# Patient Record
Sex: Male | Born: 1960 | Race: White | Hispanic: No | Marital: Single | Smoking: Current every day smoker
Health system: Southern US, Community
[De-identification: ages and names within clinical notes are randomized; demographics above are authoritative.]

---

## 2016-10-06 ENCOUNTER — Observation Stay: Payer: Self-pay

## 2016-10-06 ENCOUNTER — Encounter: Payer: Self-pay | Admitting: Emergency Medicine

## 2016-10-06 ENCOUNTER — Observation Stay
Admission: EM | Admit: 2016-10-06 | Discharge: 2016-10-07 | Disposition: A | Discharge status: Home / Self Care | Payer: Self-pay | Attending: Internal Medicine | Admitting: Internal Medicine

## 2016-10-06 ENCOUNTER — Emergency Department: Payer: Self-pay

## 2016-10-06 DIAGNOSIS — G459 Transient cerebral ischemic attack, unspecified: Secondary | ICD-10-CM

## 2016-10-06 DIAGNOSIS — H538 Other visual disturbances: Secondary | ICD-10-CM | POA: Insufficient documentation

## 2016-10-06 DIAGNOSIS — R531 Weakness: Principal | ICD-10-CM

## 2016-10-06 DIAGNOSIS — I639 Cerebral infarction, unspecified: Secondary | ICD-10-CM

## 2016-10-06 DIAGNOSIS — E876 Hypokalemia: Secondary | ICD-10-CM | POA: Insufficient documentation

## 2016-10-06 DIAGNOSIS — R55 Syncope and collapse: Secondary | ICD-10-CM | POA: Insufficient documentation

## 2016-10-06 DIAGNOSIS — R200 Anesthesia of skin: Secondary | ICD-10-CM | POA: Insufficient documentation

## 2016-10-06 DIAGNOSIS — I10 Essential (primary) hypertension: Secondary | ICD-10-CM | POA: Insufficient documentation

## 2016-10-06 DIAGNOSIS — F17210 Nicotine dependence, cigarettes, uncomplicated: Secondary | ICD-10-CM | POA: Insufficient documentation

## 2016-10-06 DIAGNOSIS — G8194 Hemiplegia, unspecified affecting left nondominant side: Secondary | ICD-10-CM

## 2016-10-06 DIAGNOSIS — R000 Tachycardia, unspecified: Secondary | ICD-10-CM

## 2016-10-06 DIAGNOSIS — R42 Dizziness and giddiness: Secondary | ICD-10-CM | POA: Insufficient documentation

## 2016-10-06 DIAGNOSIS — R739 Hyperglycemia, unspecified: Secondary | ICD-10-CM | POA: Insufficient documentation

## 2016-10-06 DIAGNOSIS — R251 Tremor, unspecified: Secondary | ICD-10-CM | POA: Insufficient documentation

## 2016-10-06 DIAGNOSIS — E872 Acidosis: Secondary | ICD-10-CM | POA: Insufficient documentation

## 2016-10-06 DIAGNOSIS — Z823 Family history of stroke: Secondary | ICD-10-CM | POA: Insufficient documentation

## 2016-10-06 LAB — TROPONIN I
Troponin I: <0.03 ng/mL (ref <0.03)
Troponin I: <0.03 ng/mL (ref <0.03)

## 2016-10-06 LAB — CBC
HCT: 52.8 % — ABNORMAL HIGH (ref 40.0–52.0)
Hemoglobin: 17.6 g/dL (ref 13.0–18.0)
MCH: 31 pg (ref 26.0–34.0)
MCHC: 33.3 g/dL (ref 32.0–36.0)
MCV: 92.8 fL (ref 80.0–100.0)
PLATELETS: 251 10*3/uL (ref 150–440)
RBC: 5.69 MIL/uL (ref 4.40–5.90)
RDW: 16.1 % — AB (ref 11.5–14.5)
WBC: 4.9 10*3/uL (ref 3.8–10.6)

## 2016-10-06 LAB — GLUCOSE, CAPILLARY: GLUCOSE-CAPILLARY: 107 mg/dL — AB (ref 65–99)

## 2016-10-06 LAB — PROTIME-INR
INR: 1.08
Prothrombin Time: 14 seconds (ref 11.4–15.2)

## 2016-10-06 LAB — DIFFERENTIAL
BASOS ABS: 0 10*3/uL (ref 0–0.1)
BASOS PCT: 1 %
EOS ABS: 0 10*3/uL (ref 0–0.7)
Eosinophils Relative: 0 %
Lymphocytes Relative: 12 %
Lymphs Abs: 0.6 10*3/uL — ABNORMAL LOW (ref 1.0–3.6)
Monocytes Absolute: 0.4 10*3/uL (ref 0.2–1.0)
Monocytes Relative: 9 %
NEUTROS ABS: 3.9 10*3/uL (ref 1.4–6.5)
NEUTROS PCT: 78 %

## 2016-10-06 LAB — MAGNESIUM: Magnesium: 1.7 mg/dL (ref 1.7–2.4)

## 2016-10-06 LAB — COMPREHENSIVE METABOLIC PANEL
ALT: 17 U/L (ref 17–63)
AST: 31 U/L (ref 15–41)
Albumin: 4.1 g/dL (ref 3.5–5.0)
Alkaline Phosphatase: 111 U/L (ref 38–126)
Anion gap: 12 (ref 5–15)
CHLORIDE: 105 mmol/L (ref 101–111)
CO2: 21 mmol/L — AB (ref 22–32)
Calcium: 9.1 mg/dL (ref 8.9–10.3)
Creatinine, Ser: 0.93 mg/dL (ref 0.61–1.24)
GFR calc Af Amer: >60 mL/min (ref >60)
Glucose, Bld: 114 mg/dL — ABNORMAL HIGH (ref 65–99)
Potassium: 2.8 mmol/L — ABNORMAL LOW (ref 3.5–5.1)
Sodium: 138 mmol/L (ref 135–145)
TOTAL PROTEIN: 7.6 g/dL (ref 6.5–8.1)
Total Bilirubin: 0.7 mg/dL (ref 0.3–1.2)

## 2016-10-06 LAB — APTT: APTT: 26 s (ref 24–36)

## 2016-10-06 LAB — LACTIC ACID, PLASMA
Lactic Acid, Venous: 1.8 mmol/L (ref 0.5–1.9)
Lactic Acid, Venous: 1.2 mmol/L (ref 0.5–1.9)

## 2016-10-06 LAB — BETA-HYDROXYBUTYRIC ACID: Beta-Hydroxybutyric Acid: 0.21 mmol/L (ref 0.05–0.27)

## 2016-10-06 LAB — POTASSIUM: Potassium: 3.6 mmol/L (ref 3.5–5.1)

## 2016-10-06 LAB — TSH: TSH: 1.141 u[IU]/mL (ref 0.350–4.500)

## 2016-10-06 MED ORDER — LORAZEPAM 2 MG/ML IJ SOLN
1.0000 mg | Freq: Once | INTRAMUSCULAR | Status: AC
  Administered 2016-10-06: 1 mg via INTRAVENOUS
  Filled 2016-10-06: qty 1

## 2016-10-06 MED ORDER — SODIUM CHLORIDE 0.9% FLUSH
3.0000 mL | Freq: Two times a day (BID) | INTRAVENOUS | Status: DC
  Administered 2016-10-07: 3 mL via INTRAVENOUS

## 2016-10-06 MED ORDER — SODIUM CHLORIDE 0.9% FLUSH: 3.0000 mL | INTRAVENOUS | Status: DC | PRN

## 2016-10-06 MED ORDER — ONDANSETRON HCL 4 MG PO TABS: 4.0000 mg | ORAL_TABLET | Freq: Four times a day (QID) | ORAL | Status: DC | PRN

## 2016-10-06 MED ORDER — ACETAMINOPHEN 325 MG PO TABS: 650.0000 mg | ORAL_TABLET | Freq: Four times a day (QID) | ORAL | Status: DC | PRN

## 2016-10-06 MED ORDER — LORAZEPAM 2 MG/ML IJ SOLN
0.5000 mg | Freq: Once | INTRAMUSCULAR | Status: AC
  Administered 2016-10-06: 0.5 mg via INTRAVENOUS
  Filled 2016-10-06: qty 1

## 2016-10-06 MED ORDER — SODIUM CHLORIDE 0.9 % IV SOLN: 250.0000 mL | INTRAVENOUS | Status: DC | PRN

## 2016-10-06 MED ORDER — SODIUM CHLORIDE 0.9% FLUSH
3.0000 mL | Freq: Two times a day (BID) | INTRAVENOUS | Status: DC
  Administered 2016-10-06: 3 mL via INTRAVENOUS

## 2016-10-06 MED ORDER — IOPAMIDOL (ISOVUE-370) INJECTION 76%
75.0000 mL | Freq: Once | INTRAVENOUS | Status: AC | PRN
  Administered 2016-10-06: 75 mL via INTRAVENOUS

## 2016-10-06 MED ORDER — POTASSIUM CHLORIDE CRYS ER 20 MEQ PO TBCR
40.0000 meq | EXTENDED_RELEASE_TABLET | ORAL | Status: AC
  Administered 2016-10-06 – 2016-10-07 (×3): 40 meq via ORAL
  Filled 2016-10-06 (×3): qty 2

## 2016-10-06 MED ORDER — ASPIRIN EC 81 MG PO TBEC
81.0000 mg | DELAYED_RELEASE_TABLET | Freq: Every day | ORAL | Status: DC
  Administered 2016-10-06 – 2016-10-07 (×2): 81 mg via ORAL
  Filled 2016-10-06 (×2): qty 1

## 2016-10-06 MED ORDER — ACETAMINOPHEN 650 MG RE SUPP: 650.0000 mg | Freq: Four times a day (QID) | RECTAL | Status: DC | PRN

## 2016-10-06 MED ORDER — ONDANSETRON HCL 4 MG/2ML IJ SOLN: 4.0000 mg | Freq: Four times a day (QID) | INTRAMUSCULAR | Status: DC | PRN

## 2016-10-06 MED ORDER — AMLODIPINE BESYLATE 5 MG PO TABS
5.0000 mg | ORAL_TABLET | Freq: Every day | ORAL | Status: DC
Start: 2016-10-06 — End: 2016-10-07
  Administered 2016-10-06 – 2016-10-07 (×2): 5 mg via ORAL
  Filled 2016-10-06 (×2): qty 1

## 2016-10-06 MED ORDER — NICOTINE 21 MG/24HR TD PT24
21.0000 mg | MEDICATED_PATCH | Freq: Every day | TRANSDERMAL | Status: DC
  Filled 2016-10-06: qty 1

## 2016-10-06 MED ORDER — ENOXAPARIN SODIUM 40 MG/0.4ML ~~LOC~~ SOLN
40.0000 mg | SUBCUTANEOUS | Status: DC
  Administered 2016-10-06: 40 mg via SUBCUTANEOUS
  Filled 2016-10-06: qty 0.4

## 2016-10-06 MED ORDER — POTASSIUM CHLORIDE CRYS ER 20 MEQ PO TBCR
40.0000 meq | EXTENDED_RELEASE_TABLET | Freq: Once | ORAL | Status: AC
  Administered 2016-10-06: 40 meq via ORAL
  Filled 2016-10-06: qty 2

## 2016-10-06 MED ORDER — IBUPROFEN 200 MG PO TABS
200.0000 mg | ORAL_TABLET | Freq: Four times a day (QID) | ORAL | Status: DC | PRN
  Filled 2016-10-06: qty 1

## 2016-10-06 NOTE — ED Triage Notes (Signed)
Pt presents to ED with son with reports of sudden onset of weakness, diaphoresis, and left arm numbness and legs tingling. C/o pain to the back of the head 8/10

## 2016-10-06 NOTE — ED Notes (Signed)
Spoke with Dr. Winona Legato about patient needing medication for claustrophobia, new order for Ativan given.

## 2016-10-06 NOTE — ED Notes (Signed)
Dr. Thad Ranger in room assessing patient.

## 2016-10-06 NOTE — ED Notes (Signed)
Code  Stroke  Called  To 333 

## 2016-10-06 NOTE — H&P (Addendum)
[facility] -  at [facility]   PATIENT NAME: Jon Michael    MR#:  [id number]  DATE OF BIRTH:  1960/09/10  DATE OF ADMISSION:  10/06/2016  PRIMARY CARE PHYSICIAN: No PCP Per Patient   REQUESTING/REFERRING PHYSICIAN:   CHIEF COMPLAINT:   Chief Complaint  Patient presents with  . Code Stroke    HISTORY OF PRESENT ILLNESS: Jon Michael  is a 56 y.o. male with Past medical history, who presents to the hospital with complaints of left-sided weakness. According to the patient, he was sitting at home at around 10 AM, when he suddenly started sweating, felt nauseated, started dry heaving, and blacked out. He did not know exactly how long he was out, however, upon awakening, he called his family and he was noted to have left-sided weakness and was brought to emergency room for further evaluation and treatment. He is also complaining of significant headaches in the back of the head for the past 2 weeks. In the hospital. He was noted to have an H SS score of 2, he was anxious, was seen by neurologist amended to admit him for possible stroke evaluation. Head CT, CT angiogram of head and neck were unremarkable.  PAST MEDICAL HISTORY:  History reviewed. No pertinent past medical history.  PAST SURGICAL HISTORY: History reviewed. No pertinent surgical history.  SOCIAL HISTORY:  Social History  Substance Use Topics  . Smoking status: Current Every Day Smoker  . Smokeless tobacco: Not on file  . Alcohol use Yes    FAMILY HISTORY: History reviewed. No pertinent family history.  DRUG ALLERGIES: No Known Allergies  Review of Systems  Constitutional: Negative for chills, fever and weight loss.  HENT: Negative for congestion.   Eyes: Positive for blurred vision. Negative for double vision.  Respiratory: Negative for cough, sputum production, shortness of breath and wheezing.   Cardiovascular: Positive for chest pain and palpitations. Negative for orthopnea, leg  swelling and PND.  Gastrointestinal: Positive for diarrhea and nausea. Negative for abdominal pain, blood in stool, constipation and vomiting.  Genitourinary: Negative for dysuria, frequency, hematuria and urgency.  Musculoskeletal: Negative for falls.  Neurological: Positive for loss of consciousness and weakness. Negative for dizziness, tremors, focal weakness and headaches.  Endo/Heme/Allergies: Does not bruise/bleed easily.  Psychiatric/Behavioral: Negative for depression. The patient does not have insomnia.     MEDICATIONS AT HOME:  Prior to Admission medications   Medication Sig Start Date End Date Taking? Authorizing Provider  Aspirin-Acetaminophen-Caffeine (GOODY HEADACHE PO) Take 1 packet by mouth as needed.   Yes Historical Provider, MD  ibuprofen (ADVIL,MOTRIN) 200 MG tablet Take 200 mg by mouth every 6 (six) hours as needed.   Yes Historical Provider, MD      PHYSICAL EXAMINATION:   VITAL SIGNS: Blood pressure (!) 134/92, pulse (!) 111, temperature 98 F (36.7 C), resp. rate (!) 21, height  (1.676 m), weight 63.5 kg (140 lb), SpO2 100 %.  GENERAL:  56 y.o.-year-old patient lying in the bed with no acute distress, somewhat anxious and fast speech.  EYES: Pupils equal, round, reactive to light and accommodation. No scleral icterus. Extraocular muscles intact.  HEENT: Head atraumatic, normocephalic. Oropharynx and nasopharynx clear.  NECK:  Supple, no jugular venous distention. No thyroid enlargement, no tenderness.  LUNGS: Normal breath sounds bilaterally, no wheezing, rales,rhonchi or crepitation. No use of accessory muscles of respiration.  CARDIOVASCULAR: S1, S2 normal. No murmurs, rubs, or gallops.  ABDOMEN: Soft, nontender, nondistended. Bowel sounds present. No  organomegaly or mass.  EXTREMITIES: No pedal edema, cyanosis, or clubbing.  NEUROLOGIC: Cranial nerves II through XII are grossly intact. Muscle strength 5/5 in upper extremities, inconsistent effort in   lower extremities, but some weakness was noted in left lower extremity. Sensation grossly intact. Gait not checked.  PSYCHIATRIC: The patient is alert and oriented x 3. Speech is normal SKIN: No obvious rash, lesion, or ulcer.   LABORATORY PANEL:   CBC  Recent Labs Lab 10/06/16 1058  WBC 4.9  HGB 17.6  HCT 52.8*  PLT 251  MCV 92.8  MCH 31.0  MCHC 33.3  RDW 16.1*  LYMPHSABS 0.6*  MONOABS 0.4  EOSABS 0.0  BASOSABS 0.0   ------------------------------------------------------------------------------------------------------------------  Chemistries   Recent Labs Lab 10/06/16 1058  NA 138  K 2.8*  CL 105  CO2 21*  GLUCOSE 114*  BUN <5*  CREATININE 0.93  CALCIUM 9.1  AST 31  ALT 17  ALKPHOS 111  BILITOT 0.7   ------------------------------------------------------------------------------------------------------------------  Cardiac Enzymes  Recent Labs Lab 10/06/16 1058  TROPONINI <0.03   ------------------------------------------------------------------------------------------------------------------  RADIOLOGY: Ct Angio Head W Or Wo Contrast  Result Date: 10/06/2016 CLINICAL DATA:  56 year old male with sudden onset weakness, diaphoresis and left extremity numbness since 1009 hours. EXAM: CT ANGIOGRAPHY HEAD AND NECK TECHNIQUE: Multidetector CT imaging of the head and neck was performed using the standard protocol during bolus administration of intravenous contrast. Multiplanar CT image reconstructions and MIPs were obtained to evaluate the vascular anatomy. Carotid stenosis measurements (when applicable) are obtained utilizing NASCET criteria, using the distal internal carotid diameter as the denominator. CONTRAST:  75 mL Isovue 370. COMPARISON:  Head CT without contrast 1049 hours today. FINDINGS: CTA NECK Skeleton: Scattered dental caries and poor dentition. Widespread cervical spine degeneration. Evidence of mild degenerative spinal stenosis at C4-C5, and mild  to moderate spinal stenosis at C5-C6. Mild upper thoracic scoliosis. No acute osseous abnormality identified. Visualized paranasal sinuses and mastoids are stable and well pneumatized. Upper chest: Mild emphysema. Mild dependent atelectasis. No superior mediastinal lymphadenopathy. Other neck: Subcentimeter bilateral thyroid nodules, do not meet consensus criteria for ultrasound follow-up. Mildly asymmetric contour of the larynx, appears benign. Negative pharynx, parapharyngeal spaces, retropharyngeal space, sublingual space, submandibular glands and parotid glands. No cervical lymphadenopathy. Aortic arch: 3 vessel arch configuration. No arch atherosclerosis or great vessel origin stenosis. Right carotid system: Negative aside from mild soft plaque in the posterior right ICA origin and bulb (series 4, image 73). No stenosis. Left carotid system: Negative. Vertebral arteries: Minimal calcified plaque at the right subclavian artery origin without stenosis. Normal right vertebral artery origin. Mildly dominant right vertebral artery is normal to the skullbase without stenosis. No proximal left subclavian artery stenosis. Normal left vertebral artery origin. Mildly non dominant left vertebral artery is normal to the skullbase. CTA HEAD Posterior circulation: No distal vertebral artery stenosis. Dominant right vertebral artery. Normal vertebrobasilar junction. Dominant appearing bilateral AICA origins are normal. No basilar stenosis. SCA and PCA origins are normal. Posterior communicating arteries are diminutive or absent. Bilateral PCA branches are within normal limits. Anterior circulation: Both ICA siphons are patent. No siphon atherosclerosis or stenosis. Normal ophthalmic artery origins. Normal carotid termini, MCA and ACA origins. Diminutive or absent anterior communicating artery. Bilateral ACA branches are normal. Left MCA M1 segment, bifurcation, and left MCA branches are normal. Right MCA M1 segment,  bifurcation, and right MCA branches are within normal limits. Venous sinuses: Patent. Anatomic variants: Dominant right vertebral artery. Delayed phase: Stable and normal gray-white  matter differentiation. No abnormal enhancement identified. Review of the MIP images confirms the above findings IMPRESSION: 1. Negative for emergent large vessel occlusion, and normal arterial findings on CTA head and neck except for mild soft plaque at the right ICA origin. 2. Stable and normal CT appearance of the brain. 3. Widespread cervical spine degeneration. Mild C4-C5 and mild to moderate C5-C6 degenerative spinal stenosis suspected. 4. Mild emphysema. Preliminary report of the above was relayed via text pager to Dr. Thana Farr on 10/06/2016 at 11:46 . Electronically Signed   By: Odessa Fleming M.D.   On: 10/06/2016 12:11   Ct Angio Neck W Or Wo Contrast  Result Date: 10/06/2016 CLINICAL DATA:  56 year old male with sudden onset weakness, diaphoresis and left extremity numbness since 1009 hours. EXAM: CT ANGIOGRAPHY HEAD AND NECK TECHNIQUE: Multidetector CT imaging of the head and neck was performed using the standard protocol during bolus administration of intravenous contrast. Multiplanar CT image reconstructions and MIPs were obtained to evaluate the vascular anatomy. Carotid stenosis measurements (when applicable) are obtained utilizing NASCET criteria, using the distal internal carotid diameter as the denominator. CONTRAST:  75 mL Isovue 370. COMPARISON:  Head CT without contrast 1049 hours today. FINDINGS: CTA NECK Skeleton: Scattered dental caries and poor dentition. Widespread cervical spine degeneration. Evidence of mild degenerative spinal stenosis at C4-C5, and mild to moderate spinal stenosis at C5-C6. Mild upper thoracic scoliosis. No acute osseous abnormality identified. Visualized paranasal sinuses and mastoids are stable and well pneumatized. Upper chest: Mild emphysema. Mild dependent atelectasis. No superior  mediastinal lymphadenopathy. Other neck: Subcentimeter bilateral thyroid nodules, do not meet consensus criteria for ultrasound follow-up. Mildly asymmetric contour of the larynx, appears benign. Negative pharynx, parapharyngeal spaces, retropharyngeal space, sublingual space, submandibular glands and parotid glands. No cervical lymphadenopathy. Aortic arch: 3 vessel arch configuration. No arch atherosclerosis or great vessel origin stenosis. Right carotid system: Negative aside from mild soft plaque in the posterior right ICA origin and bulb (series 4, image 73). No stenosis. Left carotid system: Negative. Vertebral arteries: Minimal calcified plaque at the right subclavian artery origin without stenosis. Normal right vertebral artery origin. Mildly dominant right vertebral artery is normal to the skullbase without stenosis. No proximal left subclavian artery stenosis. Normal left vertebral artery origin. Mildly non dominant left vertebral artery is normal to the skullbase. CTA HEAD Posterior circulation: No distal vertebral artery stenosis. Dominant right vertebral artery. Normal vertebrobasilar junction. Dominant appearing bilateral AICA origins are normal. No basilar stenosis. SCA and PCA origins are normal. Posterior communicating arteries are diminutive or absent. Bilateral PCA branches are within normal limits. Anterior circulation: Both ICA siphons are patent. No siphon atherosclerosis or stenosis. Normal ophthalmic artery origins. Normal carotid termini, MCA and ACA origins. Diminutive or absent anterior communicating artery. Bilateral ACA branches are normal. Left MCA M1 segment, bifurcation, and left MCA branches are normal. Right MCA M1 segment, bifurcation, and right MCA branches are within normal limits. Venous sinuses: Patent. Anatomic variants: Dominant right vertebral artery. Delayed phase: Stable and normal gray-white matter differentiation. No abnormal enhancement identified. Review of the MIP  images confirms the above findings IMPRESSION: 1. Negative for emergent large vessel occlusion, and normal arterial findings on CTA head and neck except for mild soft plaque at the right ICA origin. 2. Stable and normal CT appearance of the brain. 3. Widespread cervical spine degeneration. Mild C4-C5 and mild to moderate C5-C6 degenerative spinal stenosis suspected. 4. Mild emphysema. Preliminary report of the above was relayed via text pager to  Dr. Thana Farr on 10/06/2016 at 11:46 . Electronically Signed   By: Odessa Fleming M.D.   On: 10/06/2016 12:11   Ct Head Code Stroke W/o Cm  Result Date: 10/06/2016 CLINICAL DATA:  Code stroke. Sudden onset of left upper and lower extremity numbness orbit. Larey Seat off of a brick wall. EXAM: CT HEAD WITHOUT CONTRAST TECHNIQUE: Contiguous axial images were obtained from the base of the skull through the vertex without intravenous contrast. COMPARISON:  None. FINDINGS: Brain: No acute infarct, hemorrhage, or mass lesion is present. The ventricles are of normal size. No significant extraaxial fluid collection is present. Basal ganglia are within normal limits bilaterally. The insular ribbon is unremarkable. No focal cortical lesion is present. The brainstem and cerebellum are normal. Vascular: No hyperdense vessel or unexpected calcification. Skull: The calvarium is intact. No focal lytic or blastic lesions are present. Sinuses/Orbits: The paranasal sinuses and mastoid air cells are clear. The globes and orbits are within normal limits. ASPECTS [facility] Stroke Program Early CT Score) - Ganglionic level infarction (caudate, lentiform nuclei, internal capsule, insula, M1-M3 cortex): 7/7 - Supraganglionic infarction (M4-M6 cortex): 3/3 Total score (0-10 with 10 being normal): 10/10 IMPRESSION: 1. Normal CT of the brain for age.  No acute infarct. 2. ASPECTS is 10/10 These results were called by telephone at the time of interpretation on 10/06/2016 at 11:03 am to Dr. Jene Every ,  who verbally acknowledged these results. Electronically Signed   By: Marin Roberts M.D.   On: 10/06/2016 11:03    EKG: Orders placed or performed during the hospital encounter of 10/06/16  . ED EKG  . ED EKG  . EKG 12-Lead  . EKG 12-Lead  EKG in the Emergency room revealed sinus tachycardia at rate of 103 bpm, atrial premature complexes, left atrial enlargement, right ventricular hypertrophy, prolonged QTC to 480 ms  IMPRESSION AND PLAN:  Active Problems:   Left-sided weakness #1 left Sided weakness, admit patient to medical floor, CT of the head as well as CT angiogram of head and neck were unremarkable. appreciate neurologist input. Continue aspirin, Lipitor, lipid panel in the morning, get hemoglobin A1c, TSH, get brain MRI, echocardiogram #2 Hypokalemia, supplementing intravenously, orally, check magnesium level, recheck labs in the morning #3. Acidosis, check beta hydroxy deteriorate, lactic acid levels, show bicarbonate level in the morning #4. Hyperglycemia, get hemoglobin A1c #5. Hemoconcentration, questionable secondary due to tobacco abuse, follow in the morning with hydration #6. Tobacco abuse. Counseling, discussed this patient for 4 minutes, nicotine replacement therapy will be initiated, he is agreeable #7. Elevated blood pressure, initiate patient on Norvasc  All the records are reviewed and case discussed with ED provider. Management plans discussed with the patient, family and they are in agreement.  CODE STATUS: Code Status History    This patient does not have a recorded code status. Please follow your organizational policy for patients in this situation.       TOTAL TIME TAKING CARE OF THIS PATIENT: 50 minutes.    Katharina Caper M.D on 10/06/2016 at 1:25 PM  Between 7am to 6pm - Pager - [phone] After 6pm go to [email or web address] - password [email or web address] [facility]  [facility]  Office  [phone]  CC: Primary care physician; No PCP Per  Patient

## 2016-10-06 NOTE — Consult Note (Addendum)
Referring Physician: Roxan Hockey    Chief Complaint: Left sided weakness/numbness, blurred vision, dizziness, tremors  HPI: Jon Michael is an 56 y.o. male who reports that he awakened at baseline today.  Was sitting on a wall and had the onset of severe occipital headache, then became dizzy.  Patient fell off the wall at that time and there is a question of loss of consciousness.  Patient now with left sided numbness and weakness.  Patient reports that he has a history of headaches that are usually frontal but for the past couple of weeks they have been occipital.  Currently throbbing and rated at 8/10 severity.  It appears that right before his symptoms presented themselves he was involved in something that disturbed him greatly.   Initial NIHSS of 2.    Date last known well: Date: 10/06/2016 Time last known well: Time: 10:00 tPA Given: No: Minimal symptoms  History reviewed. No pertinent past medical history.  History reviewed. No pertinent surgical history.  Family history: Father with a stroke. Son alive and well.    Social History:  reports that he has been smoking.  He does not have any smokeless tobacco history on file. He reports that he drinks alcohol. His drug history is not on file.  Allergies: No Known Allergies  Medications: I have reviewed the patient's current medications. Prior to Admission:  None  ROS: History obtained from the patient  General ROS: negative for - chills, fatigue, fever, night sweats, weight gain or weight loss Psychological ROS: anxiety Ophthalmic ROS: as noted in HPI, longstanding vision issues with left worse than right ENT ROS: negative for - epistaxis, nasal discharge, oral lesions, sore throat, tinnitus or vertigo Allergy and Immunology ROS: negative for - hives or itchy/watery eyes Hematological and Lymphatic ROS: negative for - bleeding problems, bruising or swollen lymph nodes Endocrine ROS: negative for - galactorrhea, hair pattern changes,  polydipsia/polyuria or temperature intolerance Respiratory ROS: negative for - cough, hemoptysis, shortness of breath or wheezing Cardiovascular ROS: negative for - chest pain, dyspnea on exertion, edema or irregular heartbeat Gastrointestinal ROS: negative for - abdominal pain, diarrhea, hematemesis, nausea/vomiting or stool incontinence Genito-Urinary ROS: negative for - dysuria, hematuria, incontinence or urinary frequency/urgency Musculoskeletal ROS: negative for - joint swelling or muscular weakness Neurological ROS: as noted in HPI Dermatological ROS: negative for rash and skin lesion changes  Physical Examination: Blood pressure 127/80, pulse (!) 109, temperature 98 F (36.7 C), temperature source Oral, resp. rate 20, height  (1.676 m), weight 63.5 kg (140 lb), SpO2 100 %.  Gen: Patient anxious and hyperventilating.  Tremors all over. HEENT-  Normocephalic, no lesions, without obvious abnormality.  Normal external eye and conjunctiva.  Normal TM's bilaterally.  Normal auditory canals and external ears. Normal external nose, mucus membranes and septum.  Normal pharynx. Cardiovascular- S1, S2 normal, pulses palpable throughout   Lungs- chest clear, no wheezing, rales, normal symmetric air entry Abdomen- soft, non-tender; bowel sounds normal; no masses,  no organomegaly Extremities- no edema Lymph-no adenopathy palpable Musculoskeletal-no joint tenderness, deformity or swelling Skin-warm and dry, no hyperpigmentation, vitiligo, or suspicious lesions  Neurological Examination   Mental Status: Alert, oriented, thought content appropriate.  Speech fluent without evidence of aphasia.  Able to follow 3 step commands without difficulty.  Anxious.  Tremulous. Cranial Nerves: II: Discs flat bilaterally; Bilateral blurry vision with left worse than right.  Able to count fingers in all visual fields.  Pupils equal, round, reactive to light and accommodation III,IV, VI: ptosis  not present,  extra-ocular motions intact bilaterally V,VII: smile symmetric, facial light touch sensation decreased on the left VIII: hearing normal bilaterally IX,X: gag reflex present XI: bilateral shoulder shrug XII: midline tongue extension Motor: Right : Upper extremity   5/5    Left:     Upper extremity   5-/5  Lower extremity   5/5     Lower extremity   5-/5 Tone and bulk:normal tone throughout; no atrophy noted Sensory: Pinprick and light touch decreased in there fingertips and in the left leg below the knee.   Deep Tendon Reflexes: 2+ and symmetric throughout Plantars: Right: downgoing   Left: downgoing Cerebellar: Normal finger-to-nose and normal heel-to-shin testing bilaterally Gait: not tested due to safety concerns    Laboratory Studies:  Basic Metabolic Panel: No results for input(s): NA, K, CL, CO2, GLUCOSE, BUN, CREATININE, CALCIUM, MG, PHOS in the last 168 hours.  Liver Function Tests: No results for input(s): AST, ALT, ALKPHOS, BILITOT, PROT, ALBUMIN in the last 168 hours. No results for input(s): LIPASE, AMYLASE in the last 168 hours. No results for input(s): AMMONIA in the last 168 hours.  CBC:  Recent Labs Lab 10/06/16 1058  WBC 4.9  NEUTROABS 3.9  HGB 17.6  HCT 52.8*  MCV 92.8  PLT 251    Cardiac Enzymes: No results for input(s): CKTOTAL, CKMB, CKMBINDEX, TROPONINI in the last 168 hours.  BNP: Invalid input(s): POCBNP  CBG:  Recent Labs Lab 10/06/16 1055  GLUCAP 107*    Microbiology: No results found for this or any previous visit.  Coagulation Studies: No results for input(s): LABPROT, INR in the last 72 hours.  Urinalysis: No results for input(s): COLORURINE, LABSPEC, PHURINE, GLUCOSEU, HGBUR, BILIRUBINUR, KETONESUR, PROTEINUR, UROBILINOGEN, NITRITE, LEUKOCYTESUR in the last 168 hours.  Invalid input(s): APPERANCEUR  Lipid Panel: No results found for: CHOL, TRIG, HDL, CHOLHDL, VLDL, LDLCALC  HgbA1C: No results found for: HGBA1C  Urine  Drug Screen:  No results found for: LABOPIA, COCAINSCRNUR, LABBENZ, AMPHETMU, THCU, LABBARB  Alcohol Level: No results for input(s): ETH in the last 168 hours.  Other results: EKG: sinus tachycardia.  Imaging: Ct Head Code Stroke W/o Cm  Result Date: 10/06/2016 CLINICAL DATA:  Code stroke. Sudden onset of left upper and lower extremity numbness orbit. Larey Seat off of a brick wall. EXAM: CT HEAD WITHOUT CONTRAST TECHNIQUE: Contiguous axial images were obtained from the base of the skull through the vertex without intravenous contrast. COMPARISON:  None. FINDINGS: Brain: No acute infarct, hemorrhage, or mass lesion is present. The ventricles are of normal size. No significant extraaxial fluid collection is present. Basal ganglia are within normal limits bilaterally. The insular ribbon is unremarkable. No focal cortical lesion is present. The brainstem and cerebellum are normal. Vascular: No hyperdense vessel or unexpected calcification. Skull: The calvarium is intact. No focal lytic or blastic lesions are present. Sinuses/Orbits: The paranasal sinuses and mastoid air cells are clear. The globes and orbits are within normal limits. ASPECTS [facility] Stroke Program Early CT Score) - Ganglionic level infarction (caudate, lentiform nuclei, internal capsule, insula, M1-M3 cortex): 7/7 - Supraganglionic infarction (M4-M6 cortex): 3/3 Total score (0-10 with 10 being normal): 10/10 IMPRESSION: 1. Normal CT of the brain for age.  No acute infarct. 2. ASPECTS is 10/10 These results were called by telephone at the time of interpretation on 10/06/2016 at 11:03 am to Dr. Jene Every , who verbally acknowledged these results. Electronically Signed   By: Marin Roberts M.D.   On: 10/06/2016 11:03  Assessment: 56 y.o. male presenting with multiple symptoms including headache, dizziness, visual blurring, tremors, left sided numbness and weakness.  Patient very anxious.  NIHSS of 2.  Head CT reviewed and shows no acute  changes.  CTA of the head and neck ordered and was only remarkable for a mild soft plaque in the right ICA origin.  After discussion with the patient and his son it was decided against tPA.  Patient to be admitted for further work up.     Stroke Risk Factors - smoking  Plan: 1. HgbA1c, fasting lipid panel 2. MRI of the brain without contrast 3. PT consult, OT consult, Speech consult 4. Echocardiogram 5. Carotid dopplers 6. Prophylactic therapy-Antiplatelet med: Aspirin - dose  daily 7. NPO until RN stroke swallow screen 8. Telemetry monitoring 9. Frequent neuro checks 10. Smoking cessation counseling    Thana Farr, MD Neurology [phone] 10/06/2016, 11:13 AM

## 2016-10-06 NOTE — Progress Notes (Signed)
PT Cancellation Note  Patient Details Name: Jon Michael MRN: [id number] DOB: 1960-07-23   Cancelled Treatment:    Reason Eval/Treat Not Completed: Patient at procedure or test/unavailable (Pt currently off floor for MRI).  Will continue to follow acutely.   Encarnacion Chu PT, DPT 10/06/2016, 3:26 PM

## 2016-10-06 NOTE — ED Provider Notes (Addendum)
[facility] Emergency Department Provider Note    First MD Initiated Contact with Patient 10/06/16 1101     (approximate)  I have reviewed the triage vital signs and the nursing notes.   HISTORY  Chief Complaint Code Stroke    HPI Jon Michael is a 56 y.o. male presents with chief complaint of sudden onset back headache associated with numbness and tingling the left side of his body and blurry vision. Patient states that the symptoms started around 10 AM this morning. No trauma. Denies any chest pain. No shortness of breath. He is not on any blood thinners. No recent fevers. Has never felt like this before.   History reviewed. No pertinent past medical history. History reviewed. No pertinent family history. History reviewed. No pertinent surgical history. There are no active problems to display for this patient.     Prior to Admission medications   Medication Sig Start Date End Date Taking? Authorizing Provider  Aspirin-Acetaminophen-Caffeine (GOODY HEADACHE PO) Take 1 packet by mouth as needed.   Yes Historical Provider, MD  ibuprofen (ADVIL,MOTRIN) 200 MG tablet Take 200 mg by mouth every 6 (six) hours as needed.   Yes Historical Provider, MD    Allergies Patient has no known allergies.    Social History Social History  Substance Use Topics  . Smoking status: Current Every Day Smoker  . Smokeless tobacco: Not on file  . Alcohol use Yes    Review of Systems Patient denies headaches, rhinorrhea, blurry vision, numbness, shortness of breath, chest pain, edema, cough, abdominal pain, nausea, vomiting, diarrhea, dysuria, fevers, rashes or hallucinations unless otherwise stated above in HPI. ____________________________________________   PHYSICAL EXAM:  VITAL SIGNS: Vitals:   10/06/16 1115 10/06/16 1154  BP: (!) 134/92   Pulse: (!) 111   Resp: (!) 21   Temp:  98 F (36.7 C)    Constitutional: Alert and oriented.anxious appearing   But in no acute distress. Eyes: Conjunctivae are normal. PERRL. EOMI. Head: Atraumatic. Nose: No congestion/rhinnorhea. Mouth/Throat: Mucous membranes are moist.  Oropharynx non-erythematous. Neck: No stridor. Painless ROM. No cervical spine tenderness to palpation Hematological/Lymphatic/Immunilogical: No cervical lymphadenopathy. Cardiovascular: mildly tachycardic rate, regular rhythm. Grossly normal heart sounds.  Good peripheral circulation. Respiratory: Normal respiratory effort.  No retractions. Lungs CTAB. Gastrointestinal: Soft and nontender. No distention. No abdominal bruits. No CVA tenderness. Musculoskeletal: No lower extremity tenderness nor edema.  No joint effusions. Neurologic:  Normal speech and language. No gross focal neurologic deficits are appreciated. Subjective decreased sensation to left side.  NIH 2 Skin:  Skin is warm, dry and intact. No rash noted. Psychiatric: anxious  ____________________________________________   LABS (all labs ordered are listed, but only abnormal results are displayed)  Results for orders placed or performed during the hospital encounter of 10/06/16 (from the past 24 hour(s))  Glucose, capillary     Status: Abnormal   Collection Time: 10/06/16 10:55 AM  Result Value Ref Range   Glucose-Capillary 107 (H) 65 - 99 mg/dL  Protime-INR     Status: None   Collection Time: 10/06/16 10:58 AM  Result Value Ref Range   Prothrombin Time 14.0 11.4 - 15.2 seconds   INR 1.08   APTT     Status: None   Collection Time: 10/06/16 10:58 AM  Result Value Ref Range   aPTT 26 24 - 36 seconds  CBC     Status: Abnormal   Collection Time: 10/06/16 10:58 AM  Result Value Ref Range   WBC 4.9  3.8 - 10.6 K/uL   RBC 5.69 4.40 - 5.90 MIL/uL   Hemoglobin 17.6 13.0 - 18.0 g/dL   HCT 96.0 (H) 45.4 - 09.8 %   MCV 92.8 80.0 - 100.0 fL   MCH 31.0 26.0 - 34.0 pg   MCHC 33.3 32.0 - 36.0 g/dL   RDW 11.9 (H) 14.7 - 82.9 %   Platelets 251 150 - 440 K/uL    Differential     Status: Abnormal   Collection Time: 10/06/16 10:58 AM  Result Value Ref Range   Neutrophils Relative % 78 %   Neutro Abs 3.9 1.4 - 6.5 K/uL   Lymphocytes Relative 12 %   Lymphs Abs 0.6 (L) 1.0 - 3.6 K/uL   Monocytes Relative 9 %   Monocytes Absolute 0.4 0.2 - 1.0 K/uL   Eosinophils Relative 0 %   Eosinophils Absolute 0.0 0 - 0.7 K/uL   Basophils Relative 1 %   Basophils Absolute 0.0 0 - 0.1 K/uL  Comprehensive metabolic panel     Status: Abnormal   Collection Time: 10/06/16 10:58 AM  Result Value Ref Range   Sodium 138 135 - 145 mmol/L   Potassium 2.8 (L) 3.5 - 5.1 mmol/L   Chloride 105 101 - 111 mmol/L   CO2 21 (L) 22 - 32 mmol/L   Glucose, Bld 114 (H) 65 - 99 mg/dL   BUN <5 (L) 6 - 20 mg/dL   Creatinine, Ser 5.62 0.61 - 1.24 mg/dL   Calcium 9.1 8.9 - 13.0 mg/dL   Total Protein 7.6 6.5 - 8.1 g/dL   Albumin 4.1 3.5 - 5.0 g/dL   AST 31 15 - 41 U/L   ALT 17 17 - 63 U/L   Alkaline Phosphatase 111 38 - 126 U/L   Total Bilirubin 0.7 0.3 - 1.2 mg/dL   GFR calc non Af Amer >60 >60 mL/min   GFR calc Af Amer >60 >60 mL/min   Anion gap 12 5 - 15  Troponin I     Status: None   Collection Time: 10/06/16 10:58 AM  Result Value Ref Range   Troponin I <0.03 <0.03 ng/mL   ____________________________________________  EKG My review and personal interpretation at Time: 11:01   Indication: cva  Rate: 100  Rhythm: sinus Axis: normal Other: normal intervals, no STEMI ____________________________________________  RADIOLOGY  I personally reviewed all radiographic images ordered to evaluate for the above acute complaints and reviewed radiology reports and findings.  These findings were personally discussed with the patient.  Please see medical record for radiology report.  ____________________________________________   PROCEDURES  Procedure(s) performed:  Procedures    Critical Care performed: no ____________________________________________   INITIAL  IMPRESSION / ASSESSMENT AND PLAN / ED COURSE  Pertinent labs & imaging results that were available during my care of the patient were reviewed by me and considered in my medical decision making (see chart for details).  DDX: cva, tia, hypoglycemia, dehydration, electrolyte abnormality, dissection, sepsis   Jon Michael is a 56 y.o. who presents to the ED with complaints as described above.  Patient is AFVSS in ED. Exam as above. Given current presentation have considered the above differential.  Made code stroke due to acute deficits.  CT head wo with no AICA.  CTA with no LVO or occlusive evidence.  Patient very anxious with multiple complaints.  NIH of 2.  After discussion with Dr. Thad Ranger, Neurology, do not feel this patient would be a good candidate for tPA as his symptoms are  minor and picture is mixed at this time.  Patient will need admission for further evaluation and management.  Have discussed with the patient and available family all diagnostics and treatments performed thus far and all questions were answered to the best of my ability. The patient demonstrates understanding and agreement with plan.        ____________________________________________   FINAL CLINICAL IMPRESSION(S) / ED DIAGNOSES  Final diagnoses:  Weakness of left side of body  Tachycardia      NEW MEDICATIONS STARTED DURING THIS VISIT:  New Prescriptions   No medications on file     Note:  This document was prepared using Dragon voice recognition software and may include unintentional dictation errors.    Willy Eddy, MD 10/06/16 1247    Willy Eddy, MD 10/06/16 1250

## 2016-10-06 NOTE — ED Notes (Signed)
Dr. Thad Ranger in room to explain CT head results, plan to order CTA, notified radiology

## 2016-10-06 NOTE — Progress Notes (Signed)
OT Cancellation Note  Patient Details Name: Jon Michael MRN: [id number] DOB: 01-Jun-1961   Cancelled Treatment:    Reason Eval/Treat Not Completed: Patient at procedure or test/ unavailable.  Order received and chart reviewed.  Unable to see patient due to being in MRI.  Will attempt again tomorrow.  Susanne Borders, OTR/L ascom [phone] 10/06/16, 3:48 PM

## 2016-10-06 NOTE — ED Triage Notes (Signed)
Pt reports  A sudden onset of left side weakness with numbness at 1009am

## 2016-10-07 DIAGNOSIS — G8194 Hemiplegia, unspecified affecting left nondominant side: Secondary | ICD-10-CM

## 2016-10-07 LAB — BASIC METABOLIC PANEL
Anion gap: 6 (ref 5–15)
CALCIUM: 8.4 mg/dL — AB (ref 8.9–10.3)
CO2: 26 mmol/L (ref 22–32)
CREATININE: 0.82 mg/dL (ref 0.61–1.24)
Chloride: 107 mmol/L (ref 101–111)
GFR calc Af Amer: >60 mL/min (ref >60)
Glucose, Bld: 110 mg/dL — ABNORMAL HIGH (ref 65–99)
POTASSIUM: 3.5 mmol/L (ref 3.5–5.1)
SODIUM: 139 mmol/L (ref 135–145)

## 2016-10-07 LAB — CBC
HCT: 47.8 % (ref 40.0–52.0)
Hemoglobin: 16.2 g/dL (ref 13.0–18.0)
MCH: 31.5 pg (ref 26.0–34.0)
MCHC: 33.8 g/dL (ref 32.0–36.0)
MCV: 93.2 fL (ref 80.0–100.0)
Platelets: 202 10*3/uL (ref 150–440)
RBC: 5.13 MIL/uL (ref 4.40–5.90)
RDW: 16.6 % — ABNORMAL HIGH (ref 11.5–14.5)
WBC: 6.4 10*3/uL (ref 3.8–10.6)

## 2016-10-07 LAB — LIPID PANEL
CHOLESTEROL: 118 mg/dL (ref 0–200)
HDL: 28 mg/dL — AB (ref >40)
LDL Cholesterol: 65 mg/dL (ref 0–99)
Total CHOL/HDL Ratio: 4.2 RATIO
Triglycerides: 125 mg/dL (ref <150)
VLDL: 25 mg/dL (ref 0–40)

## 2016-10-07 LAB — TROPONIN I: Troponin I: <0.03 ng/mL (ref <0.03)

## 2016-10-07 LAB — HEMOGLOBIN A1C
HEMOGLOBIN A1C: 5 % (ref 4.8–5.6)
MEAN PLASMA GLUCOSE: 97 mg/dL

## 2016-10-07 LAB — HIV ANTIBODY (ROUTINE TESTING W REFLEX): HIV Screen 4th Generation wRfx: NONREACTIVE

## 2016-10-07 NOTE — Consult Note (Signed)
Referring Physician: Roxan Hockey    Chief Complaint: Left sided weakness/numbness, blurred vision, dizziness, tremors  Patient is much improved and he is close to his baseline  Date last known well: Date: 10/06/2016 Time last known well: Time: 10:00 tPA Given: No: Minimal symptoms  History reviewed. No pertinent past medical history.  History reviewed. No pertinent surgical history.  Family history: Father with a stroke. Son alive and well.    Social History:  reports that he has been smoking Cigarettes.  He has a 20.00 pack-year smoking history. He has never used smokeless tobacco. He reports that he drinks about 3.0 oz of alcohol per week . He reports that he does not use drugs.  Allergies: No Known Allergies  Medications: I have reviewed the patient's current medications. Prior to Admission:  None  ROS: History obtained from the patient  General ROS: negative for - chills, fatigue, fever, night sweats, weight gain or weight loss Psychological ROS: anxiety Ophthalmic ROS: as noted in HPI, longstanding vision issues with left worse than right ENT ROS: negative for - epistaxis, nasal discharge, oral lesions, sore throat, tinnitus or vertigo Allergy and Immunology ROS: negative for - hives or itchy/watery eyes Hematological and Lymphatic ROS: negative for - bleeding problems, bruising or swollen lymph nodes Endocrine ROS: negative for - galactorrhea, hair pattern changes, polydipsia/polyuria or temperature intolerance Respiratory ROS: negative for - cough, hemoptysis, shortness of breath or wheezing Cardiovascular ROS: negative for - chest pain, dyspnea on exertion, edema or irregular heartbeat Gastrointestinal ROS: negative for - abdominal pain, diarrhea, hematemesis, nausea/vomiting or stool incontinence Genito-Urinary ROS: negative for - dysuria, hematuria, incontinence or urinary frequency/urgency Musculoskeletal ROS: negative for - joint swelling or muscular  weakness Neurological ROS: as noted in HPI Dermatological ROS: negative for rash and skin lesion changes  Physical Examination: Blood pressure 121/85, pulse 87, temperature 97.9 F (36.6 C), temperature source Oral, resp. rate 18, height  (1.676 m), weight 63 kg (138 lb 14.4 oz), SpO2 97 %.  Gen: Patient anxious and hyperventilating.  Tremors all over. HEENT-  Normocephalic, no lesions, without obvious abnormality.  Normal external eye and conjunctiva.  Normal TM's bilaterally.  Normal auditory canals and external ears. Normal external nose, mucus membranes and septum.  Normal pharynx. Cardiovascular- S1, S2 normal, pulses palpable throughout   Lungs- chest clear, no wheezing, rales, normal symmetric air entry Abdomen- soft, non-tender; bowel sounds normal; no masses,  no organomegaly Extremities- no edema Lymph-no adenopathy palpable Musculoskeletal-no joint tenderness, deformity or swelling Skin-warm and dry, no hyperpigmentation, vitiligo, or suspicious lesions  Neurological Examination   Mental Status: Alert, oriented, thought content appropriate.  Speech fluent without evidence of aphasia.  Able to follow 3 step commands without difficulty.  Anxious.  Tremulous. Cranial Nerves: II: Discs flat bilaterally; Bilateral blurry vision with left worse than right.  Able to count fingers in all visual fields.  Pupils equal, round, reactive to light and accommodation III,IV, VI: ptosis not present, extra-ocular motions intact bilaterally V,VII: smile symmetric, facial light touch sensation decreased on the left VIII: hearing normal bilaterally IX,X: gag reflex present XI: bilateral shoulder shrug XII: midline tongue extension Motor: Right : Upper extremity   5/5    Left:     Upper extremity   5/5  Lower extremity   5/5     Lower extremity   5/5 Tone and bulk:normal tone throughout; no atrophy noted Sensory: Pinprick and light touch decreased in there fingertips and in the left leg  below the knee.  Deep Tendon Reflexes: 2+ and symmetric throughout Plantars: Right: downgoing   Left: downgoing Cerebellar: Normal finger-to-nose and normal heel-to-shin testing bilaterally Gait: not tested due to safety concerns    Laboratory Studies:  Basic Metabolic Panel:  Recent Labs Lab 10/06/16 1058 10/06/16 1743 10/07/16 0308  NA 138  --  139  K 2.8* 3.6 3.5  CL 105  --  107  CO2 21*  --  26  GLUCOSE 114*  --  110*  BUN <5*  --  <5*  CREATININE 0.93  --  0.82  CALCIUM 9.1  --  8.4*  MG 1.7  --   --     Liver Function Tests:  Recent Labs Lab 10/06/16 1058  AST 31  ALT 17  ALKPHOS 111  BILITOT 0.7  PROT 7.6  ALBUMIN 4.1   No results for input(s): LIPASE, AMYLASE in the last 168 hours. No results for input(s): AMMONIA in the last 168 hours.  CBC:  Recent Labs Lab 10/06/16 1058 10/07/16 0308  WBC 4.9 6.4  NEUTROABS 3.9  --   HGB 17.6 16.2  HCT 52.8* 47.8  MCV 92.8 93.2  PLT 251 202    Cardiac Enzymes:  Recent Labs Lab 10/06/16 1058 10/06/16 1527 10/06/16 2041 10/07/16 0308  TROPONINI <0.03 <0.03 <0.03 <0.03    BNP: Invalid input(s): POCBNP  CBG:  Recent Labs Lab 10/06/16 1055  GLUCAP 107*    Microbiology: No results found for this or any previous visit.  Coagulation Studies:  Recent Labs  10/06/16 1058  LABPROT 14.0  INR 1.08    Urinalysis: No results for input(s): COLORURINE, LABSPEC, PHURINE, GLUCOSEU, HGBUR, BILIRUBINUR, KETONESUR, PROTEINUR, UROBILINOGEN, NITRITE, LEUKOCYTESUR in the last 168 hours.  Invalid input(s): APPERANCEUR  Lipid Panel:    Component Value Date/Time   CHOL 118 10/07/2016 0308   TRIG 125 10/07/2016 0308   HDL 28 (L) 10/07/2016 0308   CHOLHDL 4.2 10/07/2016 0308   VLDL 25 10/07/2016 0308   LDLCALC 65 10/07/2016 0308    HgbA1C:  Lab Results  Component Value Date   HGBA1C 5.0 10/06/2016    Urine Drug Screen:  No results found for: LABOPIA, COCAINSCRNUR, LABBENZ, AMPHETMU, THCU,  LABBARB  Alcohol Level: No results for input(s): ETH in the last 168 hours.  Other results: EKG: sinus tachycardia.  Imaging: Ct Angio Head W Or Wo Contrast  Result Date: 10/06/2016 CLINICAL DATA:  56 year old male with sudden onset weakness, diaphoresis and left extremity numbness since 1009 hours. EXAM: CT ANGIOGRAPHY HEAD AND NECK TECHNIQUE: Multidetector CT imaging of the head and neck was performed using the standard protocol during bolus administration of intravenous contrast. Multiplanar CT image reconstructions and MIPs were obtained to evaluate the vascular anatomy. Carotid stenosis measurements (when applicable) are obtained utilizing NASCET criteria, using the distal internal carotid diameter as the denominator. CONTRAST:  75 mL Isovue 370. COMPARISON:  Head CT without contrast 1049 hours today. FINDINGS: CTA NECK Skeleton: Scattered dental caries and poor dentition. Widespread cervical spine degeneration. Evidence of mild degenerative spinal stenosis at C4-C5, and mild to moderate spinal stenosis at C5-C6. Mild upper thoracic scoliosis. No acute osseous abnormality identified. Visualized paranasal sinuses and mastoids are stable and well pneumatized. Upper chest: Mild emphysema. Mild dependent atelectasis. No superior mediastinal lymphadenopathy. Other neck: Subcentimeter bilateral thyroid nodules, do not meet consensus criteria for ultrasound follow-up. Mildly asymmetric contour of the larynx, appears benign. Negative pharynx, parapharyngeal spaces, retropharyngeal space, sublingual space, submandibular glands and parotid glands. No cervical lymphadenopathy. Aortic arch:  3 vessel arch configuration. No arch atherosclerosis or great vessel origin stenosis. Right carotid system: Negative aside from mild soft plaque in the posterior right ICA origin and bulb (series 4, image 73). No stenosis. Left carotid system: Negative. Vertebral arteries: Minimal calcified plaque at the right subclavian artery  origin without stenosis. Normal right vertebral artery origin. Mildly dominant right vertebral artery is normal to the skullbase without stenosis. No proximal left subclavian artery stenosis. Normal left vertebral artery origin. Mildly non dominant left vertebral artery is normal to the skullbase. CTA HEAD Posterior circulation: No distal vertebral artery stenosis. Dominant right vertebral artery. Normal vertebrobasilar junction. Dominant appearing bilateral AICA origins are normal. No basilar stenosis. SCA and PCA origins are normal. Posterior communicating arteries are diminutive or absent. Bilateral PCA branches are within normal limits. Anterior circulation: Both ICA siphons are patent. No siphon atherosclerosis or stenosis. Normal ophthalmic artery origins. Normal carotid termini, MCA and ACA origins. Diminutive or absent anterior communicating artery. Bilateral ACA branches are normal. Left MCA M1 segment, bifurcation, and left MCA branches are normal. Right MCA M1 segment, bifurcation, and right MCA branches are within normal limits. Venous sinuses: Patent. Anatomic variants: Dominant right vertebral artery. Delayed phase: Stable and normal gray-white matter differentiation. No abnormal enhancement identified. Review of the MIP images confirms the above findings IMPRESSION: 1. Negative for emergent large vessel occlusion, and normal arterial findings on CTA head and neck except for mild soft plaque at the right ICA origin. 2. Stable and normal CT appearance of the brain. 3. Widespread cervical spine degeneration. Mild C4-C5 and mild to moderate C5-C6 degenerative spinal stenosis suspected. 4. Mild emphysema. Preliminary report of the above was relayed via text pager to Dr. Thana Farr on 10/06/2016 at 11:46 . Electronically Signed   By: Odessa Fleming M.D.   On: 10/06/2016 12:11   Ct Angio Neck W Or Wo Contrast  Result Date: 10/06/2016 CLINICAL DATA:  56 year old male with sudden onset weakness, diaphoresis  and left extremity numbness since 1009 hours. EXAM: CT ANGIOGRAPHY HEAD AND NECK TECHNIQUE: Multidetector CT imaging of the head and neck was performed using the standard protocol during bolus administration of intravenous contrast. Multiplanar CT image reconstructions and MIPs were obtained to evaluate the vascular anatomy. Carotid stenosis measurements (when applicable) are obtained utilizing NASCET criteria, using the distal internal carotid diameter as the denominator. CONTRAST:  75 mL Isovue 370. COMPARISON:  Head CT without contrast 1049 hours today. FINDINGS: CTA NECK Skeleton: Scattered dental caries and poor dentition. Widespread cervical spine degeneration. Evidence of mild degenerative spinal stenosis at C4-C5, and mild to moderate spinal stenosis at C5-C6. Mild upper thoracic scoliosis. No acute osseous abnormality identified. Visualized paranasal sinuses and mastoids are stable and well pneumatized. Upper chest: Mild emphysema. Mild dependent atelectasis. No superior mediastinal lymphadenopathy. Other neck: Subcentimeter bilateral thyroid nodules, do not meet consensus criteria for ultrasound follow-up. Mildly asymmetric contour of the larynx, appears benign. Negative pharynx, parapharyngeal spaces, retropharyngeal space, sublingual space, submandibular glands and parotid glands. No cervical lymphadenopathy. Aortic arch: 3 vessel arch configuration. No arch atherosclerosis or great vessel origin stenosis. Right carotid system: Negative aside from mild soft plaque in the posterior right ICA origin and bulb (series 4, image 73). No stenosis. Left carotid system: Negative. Vertebral arteries: Minimal calcified plaque at the right subclavian artery origin without stenosis. Normal right vertebral artery origin. Mildly dominant right vertebral artery is normal to the skullbase without stenosis. No proximal left subclavian artery stenosis. Normal left vertebral artery origin. Mildly  non dominant left vertebral  artery is normal to the skullbase. CTA HEAD Posterior circulation: No distal vertebral artery stenosis. Dominant right vertebral artery. Normal vertebrobasilar junction. Dominant appearing bilateral AICA origins are normal. No basilar stenosis. SCA and PCA origins are normal. Posterior communicating arteries are diminutive or absent. Bilateral PCA branches are within normal limits. Anterior circulation: Both ICA siphons are patent. No siphon atherosclerosis or stenosis. Normal ophthalmic artery origins. Normal carotid termini, MCA and ACA origins. Diminutive or absent anterior communicating artery. Bilateral ACA branches are normal. Left MCA M1 segment, bifurcation, and left MCA branches are normal. Right MCA M1 segment, bifurcation, and right MCA branches are within normal limits. Venous sinuses: Patent. Anatomic variants: Dominant right vertebral artery. Delayed phase: Stable and normal gray-white matter differentiation. No abnormal enhancement identified. Review of the MIP images confirms the above findings IMPRESSION: 1. Negative for emergent large vessel occlusion, and normal arterial findings on CTA head and neck except for mild soft plaque at the right ICA origin. 2. Stable and normal CT appearance of the brain. 3. Widespread cervical spine degeneration. Mild C4-C5 and mild to moderate C5-C6 degenerative spinal stenosis suspected. 4. Mild emphysema. Preliminary report of the above was relayed via text pager to Dr. Thana Farr on 10/06/2016 at 11:46 . Electronically Signed   By: Odessa Fleming M.D.   On: 10/06/2016 12:11   Mr Brain Wo Contrast  Result Date: 10/06/2016 CLINICAL DATA:  Sudden onset of posterior headache associated with numbness and tingling on the left side of the body. Blurred vision. EXAM: MRI HEAD WITHOUT CONTRAST TECHNIQUE: Multiplanar, multiecho pulse sequences of the brain and surrounding structures were obtained without intravenous contrast. COMPARISON:  CTA head and neck from the same  date. CT head without contrast from the same day. FINDINGS: Brain: Minimal white matter changes within normal limits for age. No acute infarct, hemorrhage, or mass lesion is present. The ventricles are of normal size. The brainstem and cerebellum are normal. The internal auditory canals are within normal limits bilaterally. No significant extraaxial fluid collection is present. Vascular: Flow is present in the major intracranial arteries. Skull and upper cervical spine: The skullbase is within normal limits. Midline sagittal structures are unremarkable. Sinuses/Orbits: The paranasal sinuses and mastoid air cells are clear. The globes and orbits are normal. IMPRESSION: Negative MRI of the brain. Electronically Signed   By: Marin Roberts M.D.   On: 10/06/2016 16:13   Ct Head Code Stroke W/o Cm  Result Date: 10/06/2016 CLINICAL DATA:  Code stroke. Sudden onset of left upper and lower extremity numbness orbit. Larey Seat off of a brick wall. EXAM: CT HEAD WITHOUT CONTRAST TECHNIQUE: Contiguous axial images were obtained from the base of the skull through the vertex without intravenous contrast. COMPARISON:  None. FINDINGS: Brain: No acute infarct, hemorrhage, or mass lesion is present. The ventricles are of normal size. No significant extraaxial fluid collection is present. Basal ganglia are within normal limits bilaterally. The insular ribbon is unremarkable. No focal cortical lesion is present. The brainstem and cerebellum are normal. Vascular: No hyperdense vessel or unexpected calcification. Skull: The calvarium is intact. No focal lytic or blastic lesions are present. Sinuses/Orbits: The paranasal sinuses and mastoid air cells are clear. The globes and orbits are within normal limits. ASPECTS [facility] Stroke Program Early CT Score) - Ganglionic level infarction (caudate, lentiform nuclei, internal capsule, insula, M1-M3 cortex): 7/7 - Supraganglionic infarction (M4-M6 cortex): 3/3 Total score (0-10 with 10  being normal): 10/10 IMPRESSION: 1. Normal CT of the  brain for age.  No acute infarct. 2. ASPECTS is 10/10 These results were called by telephone at the time of interpretation on 10/06/2016 at 11:03 am to Dr. Jene Every , who verbally acknowledged these results. Electronically Signed   By: Marin Roberts M.D.   On: 10/06/2016 11:03    Assessment: 56 y.o. male presenting with multiple symptoms including headache, dizziness, visual blurring, tremors, left sided numbness and weakness.  Patient very anxious.    Close to baseline at this point,  Stroke Risk Factors - smoking  MRI brain no acute abnormalities and soft plaque in the RICA - d/c planning if approved by PT - con't ASA daily - call with questions  10/07/2016, 11:59 AM

## 2016-10-07 NOTE — Evaluation (Signed)
Occupational Therapy Evaluation Patient Details Name: Jon Michael MRN: [id number] DOB: [birth year] Today's Date: 10/07/2016    History of Present Illness Patient states he had a headache across the back of his head that started a few days ago. Had been working in yard, and then sat on step to rest. Had syncope event with difficulty standing due to weakness and numbness afterward. Son brought him to the ED. Patient states at time he was unable to use LUE to hold items. States it is improving since admission.   Clinical Impression   Patient was lying in bed when OT arrived. Stated he had just finished walking around nursing station with PT. Agreeable for OT evaluation. States numbness has greatly reduced to just being on the pads of the 4 fingers of his left hand. Also notes a little of similar numbness in toes. Patient had no difficulties with sharp/dull testing in either UE. Able to perform BUE movements WFL. Able to pick up and manipulate items with LUE today. No deficits noted functionally with any task. Patient grip strength noted to be 75# for RUE and 45# for LUE. Patient provided education on [facility] tasks he can continue to perform for LUE. Patient in agreement with no further need for OT at this time.    Follow Up Recommendations  No OT follow up    Equipment Recommendations       Recommendations for Other Services       Precautions / Restrictions Precautions Precautions: None Restrictions Weight Bearing Restrictions: No      Mobility Bed Mobility Overal bed mobility: Independent                Transfers Overall transfer level: Independent                    Balance                                           ADL either performed or assessed with clinical judgement   ADL Overall ADL's : Independent                                             Vision         Perception     Praxis      Pertinent Vitals/Pain Pain  Assessment: No/denies pain     Hand Dominance Right   Extremity/Trunk Assessment Upper Extremity Assessment Upper Extremity Assessment: LUE deficits/detail LUE Deficits / Details:  (Patient BUE strength WFL for BUE. Grip Strength RUE 75# and 45# for LUE) LUE Sensation:  (Patient states he has numbness on LUE fingertip pads. No deficits noted with sharp/dull test.) LUE Coordination:  (Patient able to perform [facility] tasks today with LUE.)   Lower Extremity Assessment Lower Extremity Assessment: Overall WFL for tasks assessed       Communication Communication Communication: No difficulties   Cognition Arousal/Alertness: Awake/alert Behavior During Therapy: WFL for tasks assessed/performed Overall Cognitive Status: Within Functional Limits for tasks assessed                                     General Comments       Exercises  Shoulder Instructions      Home Living Family/patient expects to be discharged to:: Private residence Living Arrangements: Children   Type of Home: House                                  Prior Functioning/Environment Level of Independence: Independent                 OT Problem List: Decreased coordination;Impaired sensation      OT Treatment/Interventions:      OT Goals(Current goals can be found in the care plan section) Acute Rehab OT Goals Patient Stated Goal: Get back home and to work. OT Goal Formulation: All assessment and education complete, DC therapy Potential to Achieve Goals: Good  OT Frequency:     Barriers to D/C:            Co-evaluation              End of Session    Activity Tolerance: Patient tolerated treatment well Patient left: in bed  OT Visit Diagnosis: Hemiplegia and hemiparesis Hemiplegia - Right/Left: Left Hemiplegia - dominant/non-dominant: Non-Dominant Hemiplegia - caused by: Unspecified                Time: 1610-9604 OT Time Calculation (min): 22  min Charges:  OT General Charges $OT Visit: 1 Procedure OT Evaluation $OT Eval Low Complexity: 1 Procedure G-Codes: OT G-codes **NOT FOR INPATIENT CLASS** Functional Assessment Tool Used: AM-PAC 6 Clicks Daily Activity Functional Limitation: Carrying, moving and handling objects Carrying, Moving and Handling Objects Current Status (V4098): 0 percent impaired, limited or restricted Carrying, Moving and Handling Objects Goal Status (J1914): 0 percent impaired, limited or restricted Carrying, Moving and Handling Objects Discharge Status (N8295): 0 percent impaired, limited or restricted   Kirstie Peri, MOTR/L  Elanor Cale L 10/07/2016, 12:31 PM

## 2016-10-07 NOTE — Evaluation (Signed)
Physical Therapy Evaluation Patient Details Name: Jon Michael MRN: [id number] DOB: [birth year] Today's Date: 10/07/2016   History of Present Illness  Patient states he had a headache across the back of his head that started a few days ago. Had been working in yard, and then sat on step to rest. Had syncope event with difficulty standing due to weakness and numbness afterward. Son brought him to the ED. Patient states at time he was unable to use LUE to hold items. States it is improving since admission. Imaging negative for CVA at this time.   Clinical Impression  Pt is a pleasant 56 year old male who was admitted for possible CVA. Imaging negative at this time. Pt demonstrates all bed mobility/transfers/ambulation at baseline level. Slight decreased strength in L hip flexor, however reports it is improved from previous date. Does not affect mobility and pt appears at baseline level. Coordination intact. Slight decreased sensation in L hand fingertips. Pt does not require any further PT needs at this time. Pt will be dc in house and does not require follow up. RN aware. Will dc current orders.      Follow Up Recommendations No PT follow up    Equipment Recommendations  None recommended by PT    Recommendations for Other Services       Precautions / Restrictions Precautions Precautions: None Restrictions Weight Bearing Restrictions: No      Mobility  Bed Mobility Overal bed mobility: Independent                Transfers Overall transfer level: Independent               General transfer comment: no difficulties noted  Ambulation/Gait Ambulation/Gait assistance: Supervision Ambulation Distance (Feet): 200 Feet Assistive device: None Gait Pattern/deviations: WFL(Within Functional Limits)     General Gait Details: ambulated using safe technique with reciprocal gait pattern and arm swing. No LOB noted noted.  Stairs            Wheelchair Mobility     Modified Rankin (Stroke Patients Only)       Balance Overall balance assessment: Independent                                           Pertinent Vitals/Pain Pain Assessment: No/denies pain    Home Living Family/patient expects to be discharged to:: Private residence Living Arrangements: Children Available Help at Discharge: Family Type of Home: House         Home Equipment: None      Prior Function Level of Independence: Independent               Hand Dominance   Dominant Hand: Right    Extremity/Trunk Assessment   Upper Extremity Assessment Upper Extremity Assessment: Defer to OT evaluation LUE Deficits / Details:  (Patient BUE strength WFL for BUE. Grip Strength RUE 75# and 45# for LUE) LUE Sensation:  (Patient states he has numbness on LUE fingertip pads. No deficits noted with sharp/dull test.) LUE Coordination:  (Patient able to perform [facility] tasks today with LUE.)    Lower Extremity Assessment Lower Extremity Assessment:  (slight weakness noted in L hip flexion; 5-/5)       Communication   Communication: No difficulties  Cognition Arousal/Alertness: Awake/alert Behavior During Therapy: WFL for tasks assessed/performed Overall Cognitive Status: Within Functional Limits for tasks assessed  General Comments      Exercises     Assessment/Plan    PT Assessment Patent does not need any further PT services  PT Problem List         PT Treatment Interventions      PT Goals (Current goals can be found in the Care Plan section)  Acute Rehab PT Goals Patient Stated Goal: Get back home and to work. PT Goal Formulation: All assessment and education complete, DC therapy Time For Goal Achievement: 10/07/16 Potential to Achieve Goals: Good    Frequency     Barriers to discharge        Co-evaluation               End of Session Equipment Utilized During  Treatment: Gait belt Activity Tolerance: Patient tolerated treatment well Patient left: in bed Nurse Communication: Mobility status PT Visit Diagnosis: Unsteadiness on feet (R26.81)    Time: 6045-4098 PT Time Calculation (min) (ACUTE ONLY): 13 min   Charges:   PT Evaluation $PT Eval Low Complexity: 1 Procedure     PT G Codes:   PT G-Codes **NOT FOR INPATIENT CLASS** Functional Assessment Tool Used: AM-PAC 6 Clicks Basic Mobility Functional Limitation: Mobility: Walking and moving around Mobility: Walking and Moving Around Current Status (J1914): 0 percent impaired, limited or restricted Mobility: Walking and Moving Around Goal Status (N8295): 0 percent impaired, limited or restricted Mobility: Walking and Moving Around Discharge Status (A2130): 0 percent impaired, limited or restricted    Elizabeth Palau, PT, DPT [phone]   Brody Kump 10/07/2016, 12:35 PM

## 2016-10-07 NOTE — Progress Notes (Signed)
Discharge instructions given and went over with patient at bedside. All questions answered. Patient discharged home with son via wheelchair by nursing staff. Mackena Plummer S, RN  

## 2016-10-07 NOTE — Progress Notes (Signed)
SLP Cancellation Note  Patient Details Name: Jon Michael MRN: [id number] DOB: 02/21/1961   Cancelled treatment:       Reason Eval/Treat Not Completed: SLP screened, no needs identified, will sign off (chart reviewed; consulted NSG then met w/ pt). Pt denied any difficulty swallowing and is currently on a regular diet; tolerates swallowing pills w/ water per NSG. Pt conversed at conversational level w/out deficits noted; pt denied any speech-language deficits.  No further skilled ST services indicated as pt appears at his baseline. Pt agreed. NSG to reconsult if any change in status.    Katherine Watson, MS, CCC-SLP Watson,Katherine 10/07/2016, 11:04 AM   

## 2016-10-08 NOTE — Discharge Summary (Signed)
[employer] [facility] [place] [facility]   PATIENT NAME: Jon Michael    MR#:  [id number]  DATE OF BIRTH:  April 30, 1961  DATE OF ADMISSION:  10/06/2016   ADMITTING PHYSICIAN: Katharina Caper, MD  DATE OF DISCHARGE: 10/07/2016  2:15 PM  PRIMARY CARE PHYSICIAN: No PCP Per Patient   ADMISSION DIAGNOSIS:   Tachycardia [R00.0] Cerebral infarction (HCC) [I63.9] CVA (cerebral infarction) [I63.9] Left hemiparesis (HCC) [G81.94] Weakness of left side of body [R53.1]  DISCHARGE DIAGNOSIS:   Active Problems:   Left-sided weakness   SECONDARY DIAGNOSIS:   History reviewed. No pertinent past medical history.  HOSPITAL COURSE:   56 year old male with no significant past medical history admitted with presyncopal episode associated with left-sided weakness and dizziness.  #1 presyncope with left-sided weakness-initially admitted to rule out TIA. MRI of the brain did not reveal any acute abnormalities. -Likely dehydration and also hypokalemia. -Negative for orthostatic blood pressure drop. -CT angiogram of the head and neck with minimal plaque disease but no significant stenosis. -Patient was asymptomatic at discharge. Advised to take baby aspirin or the counter as needed.  Worked well with physical therapy and is at baseline. Slightly decreased sensation in left hand fingertips-please evaluate for carpal tunnel as outpatient.  Being discharged today.  DISCHARGE CONDITIONS:   Stable CONSULTS OBTAINED:   Treatment Team:  Kym Groom, MD  DRUG ALLERGIES:   No Known Allergies DISCHARGE MEDICATIONS:   Allergies as of 10/07/2016   No Known Allergies     Medication List    STOP taking these medications   GOODY HEADACHE PO     TAKE these medications   ibuprofen 200 MG tablet Commonly known as:  ADVIL,MOTRIN Take 200 mg by mouth every 6 (six) hours as needed.        DISCHARGE INSTRUCTIONS:   1. PCP follow-up in 1-2 weeks  DIET:   Regular  diet  ACTIVITY:   Activity as tolerated  OXYGEN:   Home Oxygen: No.  Oxygen Delivery: room air  DISCHARGE LOCATION:   home   If you experience worsening of your admission symptoms, develop shortness of breath, life threatening emergency, suicidal or homicidal thoughts you must seek medical attention immediately by calling 911 or calling your MD immediately  if symptoms less severe.  You Must read complete instructions/literature along with all the possible adverse reactions/side effects for all the Medicines you take and that have been prescribed to you. Take any new Medicines after you have completely understood and accpet all the possible adverse reactions/side effects.   Please note  You were cared for by a hospitalist during your hospital stay. If you have any questions about your discharge medications or the care you received while you were in the hospital after you are discharged, you can call the unit and asked to speak with the hospitalist on call if the hospitalist that took care of you is not available. Once you are discharged, your primary care physician will handle any further medical issues. Please note that NO REFILLS for any discharge medications will be authorized once you are discharged, as it is imperative that you return to your primary care physician (or establish a relationship with a primary care physician if you do not have one) for your aftercare needs so that they can reassess your need for medications and monitor your lab values.    On the day of Discharge:  VITAL SIGNS:   Blood pressure 125/82, pulse 96, temperature 98.2 F (36.8 C), temperature  source Oral, resp. rate 18, height  (1.676 m), weight 63 kg (138 lb 14.4 oz), SpO2 100 %.  PHYSICAL EXAMINATION:    GENERAL:  56 y.o.-year-old patient lying in the bed with no acute distress.  EYES: Pupils equal, round, reactive to light and accommodation. No scleral icterus. Extraocular muscles intact.   HEENT: Head atraumatic, normocephalic. Oropharynx and nasopharynx clear.  NECK:  Supple, no jugular venous distention. No thyroid enlargement, no tenderness.  LUNGS: Normal breath sounds bilaterally, no wheezing, rales,rhonchi or crepitation. No use of accessory muscles of respiration.  CARDIOVASCULAR: S1, S2 normal. No murmurs, rubs, or gallops.  ABDOMEN: Soft, non-tender, non-distended. Bowel sounds present. No organomegaly or mass.  EXTREMITIES: No pedal edema, cyanosis, or clubbing.  NEUROLOGIC: Cranial nerves II through XII are intact. Muscle strength 5/5 in all extremities. Sensation intact. Gait not checked.  PSYCHIATRIC: The patient is alert and oriented x 3.  SKIN: No obvious rash, lesion, or ulcer.   DATA REVIEW:   CBC  Recent Labs Lab 10/07/16 0308  WBC 6.4  HGB 16.2  HCT 47.8  PLT 202    Chemistries   Recent Labs Lab 10/06/16 1058  10/07/16 0308  NA 138  --  139  K 2.8*  < > 3.5  CL 105  --  107  CO2 21*  --  26  GLUCOSE 114*  --  110*  BUN <5*  --  <5*  CREATININE 0.93  --  0.82  CALCIUM 9.1  --  8.4*  MG 1.7  --   --   AST 31  --   --   ALT 17  --   --   ALKPHOS 111  --   --   BILITOT 0.7  --   --   < > = values in this interval not displayed.   Microbiology Results  No results found for this or any previous visit.  RADIOLOGY:  No results found.   Management plans discussed with the patient, family and they are in agreement.  CODE STATUS:  Code Status History    Date Active Date Inactive Code Status Order ID Comments User Context   10/06/2016  3:04 PM 10/07/2016  5:30 PM Full Code [id number]  Katharina Caper, MD Inpatient      TOTAL TIME TAKING CARE OF THIS PATIENT: 37 minutes.    Enid Baas M.D on 10/08/2016 at 12:56 PM  Between 7am to 6pm - Pager - [phone]  After 6pm go to [email or web address] - Scientist, research (life sciences) [facility]  Office  [phone]  CC: Primary care physician; No PCP Per  Patient   Note: This dictation was prepared with Dragon dictation along with smaller phrase technology. Any transcriptional errors that result from this process are unintentional.

## 2017-10-20 IMAGING — CT CT ANGIO NECK
1 of 8 series · 6 of 33 positions shown · IV contrast (APPLIED)
Comparison: Head CT without contrast 6794 hours today.

CLINICAL DATA: 56-year-old male with sudden onset weakness,
diaphoresis and left extremity numbness since 1118 hours.

EXAM:
CT ANGIOGRAPHY HEAD AND NECK
TECHNIQUE: Multidetector CT imaging of the head and neck was performed using
the standard protocol during bolus administration of intravenous
contrast. Multiplanar CT image reconstructions and MIPs were
obtained to evaluate the vascular anatomy. Carotid stenosis
measurements (when applicable) are obtained utilizing NASCET
criteria, using the distal internal carotid diameter as the
denominator.
CONTRAST:  75 mL Isovue 370.

[Series 6: ax thin · axial · 0.39mm/px · z∈[-312,-48]mm · 6 of 370 slices shown]
[im 53/370  soft-tissue]
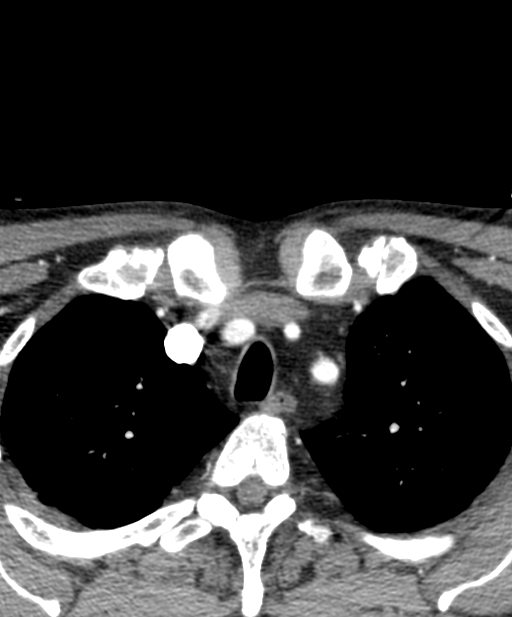
[im 106/370  bone]
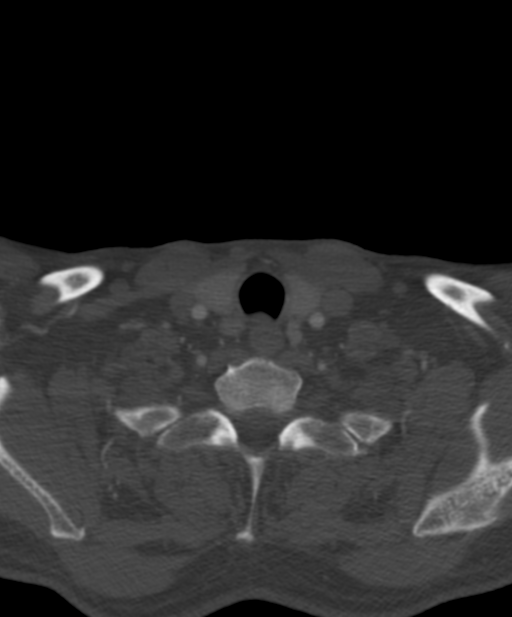
[im 159/370  soft-tissue]
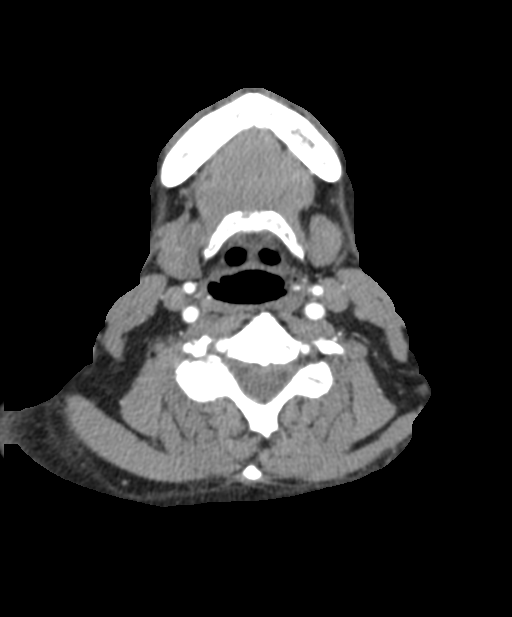
[im 211/370  bone]
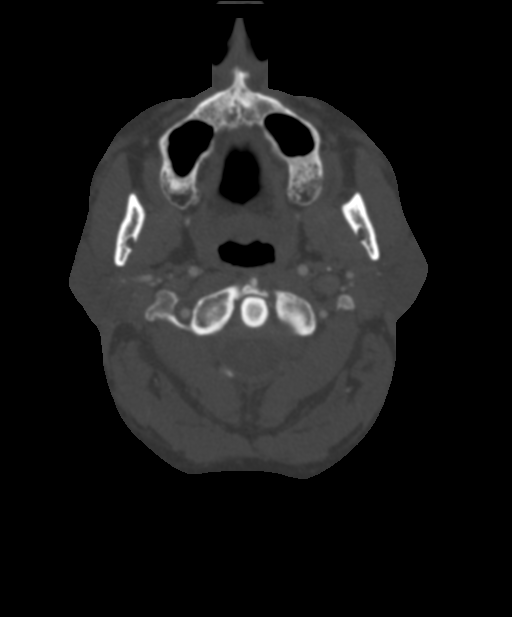
[im 264/370  soft-tissue]
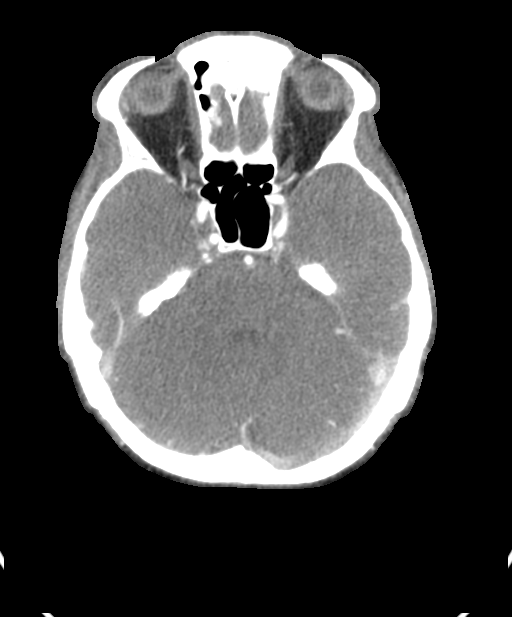
[im 317/370  bone]
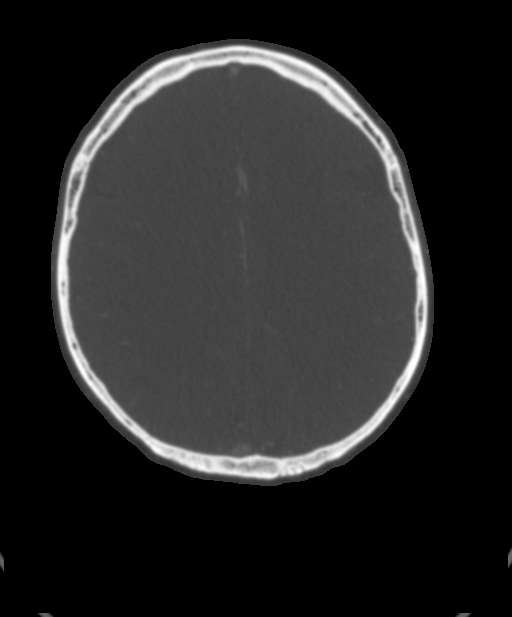

[6 of 33 positions shown; findings below may reference images not displayed]

FINDINGS: CTA NECK

Skeleton: Scattered dental caries and poor dentition. Widespread
cervical spine degeneration. Evidence of mild degenerative spinal
stenosis at C4-C5, and mild to moderate spinal stenosis at C5-C6.
Mild upper thoracic scoliosis. No acute osseous abnormality
identified.

Visualized paranasal sinuses and mastoids are stable and well
pneumatized.

Upper chest: Mild emphysema. Mild dependent atelectasis. No superior
mediastinal lymphadenopathy.

Other neck: Subcentimeter bilateral thyroid nodules, do not meet
consensus criteria for ultrasound follow-up. Mildly asymmetric
contour of the larynx, appears benign. Negative pharynx,
parapharyngeal spaces, retropharyngeal space, sublingual space,
submandibular glands and parotid glands. No cervical
lymphadenopathy.

Aortic arch: 3 vessel arch configuration. No arch atherosclerosis or
great vessel origin stenosis.

Right carotid system: Negative aside from mild soft plaque in the
posterior right ICA origin and bulb (series 4, image 73). No
stenosis.

Left carotid system: Negative.

Vertebral arteries:

Minimal calcified plaque at the right subclavian artery origin
without stenosis. Normal right vertebral artery origin. Mildly
dominant right vertebral artery is normal to the skullbase without
stenosis.

No proximal left subclavian artery stenosis. Normal left vertebral
artery origin. Mildly non dominant left vertebral artery is normal
to the skullbase.

CTA HEAD

Posterior circulation: No distal vertebral artery stenosis. Dominant
right vertebral artery. Normal vertebrobasilar junction. Dominant
appearing bilateral AICA origins are normal. No basilar stenosis.
SCA and PCA origins are normal. Posterior communicating arteries are
diminutive or absent. Bilateral PCA branches are within normal
limits.

Anterior circulation: Both ICA siphons are patent. No siphon
atherosclerosis or stenosis. Normal ophthalmic artery origins.
Normal carotid termini, MCA and ACA origins. Diminutive or absent
anterior communicating artery. Bilateral ACA branches are normal.
Left MCA M1 segment, bifurcation, and left MCA branches are normal.
Right MCA M1 segment, bifurcation, and right MCA branches are within
normal limits.

Venous sinuses: Patent.

Anatomic variants: Dominant right vertebral artery.

Delayed phase: Stable and normal gray-white matter differentiation.
No abnormal enhancement identified.

Review of the MIP images confirms the above findings
IMPRESSION: 1. Negative for emergent large vessel occlusion, and normal arterial
findings on CTA head and neck except for mild soft plaque at the
right ICA origin.
2. Stable and normal CT appearance of the brain.
3. Widespread cervical spine degeneration. Mild C4-C5 and mild to
moderate C5-C6 degenerative spinal stenosis suspected.
4. Mild emphysema.
Preliminary report of the above was relayed via text pager to Dr.

## 2024-02-10 DIAGNOSIS — R1030 Lower abdominal pain, unspecified: Secondary | ICD-10-CM | POA: Diagnosis not present

## 2024-02-10 DIAGNOSIS — R109 Unspecified abdominal pain: Secondary | ICD-10-CM | POA: Diagnosis not present

## 2024-02-10 DIAGNOSIS — R0789 Other chest pain: Secondary | ICD-10-CM | POA: Diagnosis not present

## 2024-02-10 DIAGNOSIS — R918 Other nonspecific abnormal finding of lung field: Secondary | ICD-10-CM | POA: Diagnosis not present

## 2024-02-10 DIAGNOSIS — R079 Chest pain, unspecified: Secondary | ICD-10-CM | POA: Diagnosis not present

## 2024-02-10 DIAGNOSIS — K4090 Unilateral inguinal hernia, without obstruction or gangrene, not specified as recurrent: Secondary | ICD-10-CM | POA: Diagnosis not present

## 2024-02-11 DIAGNOSIS — R079 Chest pain, unspecified: Secondary | ICD-10-CM | POA: Diagnosis not present

## 2024-02-22 DIAGNOSIS — R609 Edema, unspecified: Secondary | ICD-10-CM | POA: Diagnosis not present

## 2024-02-22 DIAGNOSIS — M51369 Other intervertebral disc degeneration, lumbar region without mention of lumbar back pain or lower extremity pain: Secondary | ICD-10-CM | POA: Diagnosis not present

## 2024-02-22 DIAGNOSIS — M5124 Other intervertebral disc displacement, thoracic region: Secondary | ICD-10-CM | POA: Diagnosis not present

## 2024-02-22 DIAGNOSIS — G9529 Other cord compression: Secondary | ICD-10-CM | POA: Diagnosis not present

## 2024-02-22 DIAGNOSIS — M50221 Other cervical disc displacement at C4-C5 level: Secondary | ICD-10-CM | POA: Diagnosis not present

## 2024-02-22 DIAGNOSIS — M50222 Other cervical disc displacement at C5-C6 level: Secondary | ICD-10-CM | POA: Diagnosis not present

## 2024-02-22 DIAGNOSIS — C799 Secondary malignant neoplasm of unspecified site: Secondary | ICD-10-CM | POA: Diagnosis not present

## 2024-02-22 DIAGNOSIS — R531 Weakness: Secondary | ICD-10-CM | POA: Diagnosis not present

## 2024-02-22 DIAGNOSIS — M5030 Other cervical disc degeneration, unspecified cervical region: Secondary | ICD-10-CM | POA: Diagnosis not present

## 2024-02-22 DIAGNOSIS — G9520 Unspecified cord compression: Secondary | ICD-10-CM | POA: Diagnosis not present

## 2024-02-23 DIAGNOSIS — R339 Retention of urine, unspecified: Secondary | ICD-10-CM | POA: Diagnosis not present

## 2024-02-23 DIAGNOSIS — R29898 Other symptoms and signs involving the musculoskeletal system: Secondary | ICD-10-CM | POA: Diagnosis not present

## 2024-02-23 DIAGNOSIS — M50222 Other cervical disc displacement at C5-C6 level: Secondary | ICD-10-CM | POA: Diagnosis not present

## 2024-02-23 DIAGNOSIS — R918 Other nonspecific abnormal finding of lung field: Secondary | ICD-10-CM | POA: Diagnosis not present

## 2024-02-23 DIAGNOSIS — C801 Malignant (primary) neoplasm, unspecified: Secondary | ICD-10-CM | POA: Diagnosis not present

## 2024-02-23 DIAGNOSIS — F17210 Nicotine dependence, cigarettes, uncomplicated: Secondary | ICD-10-CM | POA: Diagnosis not present

## 2024-02-23 DIAGNOSIS — M5124 Other intervertebral disc displacement, thoracic region: Secondary | ICD-10-CM | POA: Diagnosis not present

## 2024-02-23 DIAGNOSIS — C7951 Secondary malignant neoplasm of bone: Secondary | ICD-10-CM | POA: Diagnosis not present

## 2024-02-23 DIAGNOSIS — M4804 Spinal stenosis, thoracic region: Secondary | ICD-10-CM | POA: Diagnosis not present

## 2024-02-23 DIAGNOSIS — G9520 Unspecified cord compression: Secondary | ICD-10-CM | POA: Diagnosis not present

## 2024-02-23 DIAGNOSIS — D751 Secondary polycythemia: Secondary | ICD-10-CM | POA: Diagnosis not present

## 2024-02-23 DIAGNOSIS — I4510 Unspecified right bundle-branch block: Secondary | ICD-10-CM | POA: Diagnosis not present

## 2024-02-23 DIAGNOSIS — R200 Anesthesia of skin: Secondary | ICD-10-CM | POA: Diagnosis not present

## 2024-02-24 DIAGNOSIS — M4804 Spinal stenosis, thoracic region: Secondary | ICD-10-CM | POA: Diagnosis not present

## 2024-02-24 DIAGNOSIS — C801 Malignant (primary) neoplasm, unspecified: Secondary | ICD-10-CM | POA: Diagnosis not present

## 2024-02-24 DIAGNOSIS — R339 Retention of urine, unspecified: Secondary | ICD-10-CM | POA: Diagnosis not present

## 2024-02-24 DIAGNOSIS — F17210 Nicotine dependence, cigarettes, uncomplicated: Secondary | ICD-10-CM | POA: Diagnosis not present

## 2024-02-24 DIAGNOSIS — C7951 Secondary malignant neoplasm of bone: Secondary | ICD-10-CM | POA: Diagnosis not present

## 2024-02-25 DIAGNOSIS — G9529 Other cord compression: Secondary | ICD-10-CM | POA: Diagnosis not present

## 2024-02-25 DIAGNOSIS — C7949 Secondary malignant neoplasm of other parts of nervous system: Secondary | ICD-10-CM | POA: Diagnosis not present

## 2024-02-25 DIAGNOSIS — D751 Secondary polycythemia: Secondary | ICD-10-CM | POA: Diagnosis not present

## 2024-02-25 DIAGNOSIS — M4804 Spinal stenosis, thoracic region: Secondary | ICD-10-CM | POA: Diagnosis not present

## 2024-02-25 DIAGNOSIS — E46 Unspecified protein-calorie malnutrition: Secondary | ICD-10-CM | POA: Diagnosis not present

## 2024-02-25 DIAGNOSIS — C3490 Malignant neoplasm of unspecified part of unspecified bronchus or lung: Secondary | ICD-10-CM | POA: Diagnosis not present

## 2024-02-25 DIAGNOSIS — G9520 Unspecified cord compression: Secondary | ICD-10-CM | POA: Diagnosis not present

## 2024-02-25 DIAGNOSIS — C782 Secondary malignant neoplasm of pleura: Secondary | ICD-10-CM | POA: Diagnosis not present

## 2024-02-26 DIAGNOSIS — Z79899 Other long term (current) drug therapy: Secondary | ICD-10-CM | POA: Diagnosis not present

## 2024-02-26 DIAGNOSIS — N319 Neuromuscular dysfunction of bladder, unspecified: Secondary | ICD-10-CM | POA: Diagnosis not present

## 2024-02-26 DIAGNOSIS — M4802 Spinal stenosis, cervical region: Secondary | ICD-10-CM | POA: Diagnosis not present

## 2024-02-26 DIAGNOSIS — Z136 Encounter for screening for cardiovascular disorders: Secondary | ICD-10-CM | POA: Diagnosis not present

## 2024-02-26 DIAGNOSIS — S24109A Unspecified injury at unspecified level of thoracic spinal cord, initial encounter: Secondary | ICD-10-CM | POA: Diagnosis not present

## 2024-02-27 DIAGNOSIS — G9519 Other vascular myelopathies: Secondary | ICD-10-CM | POA: Diagnosis not present

## 2024-02-27 DIAGNOSIS — M4804 Spinal stenosis, thoracic region: Secondary | ICD-10-CM | POA: Diagnosis not present

## 2024-03-25 DIAGNOSIS — C3481 Malignant neoplasm of overlapping sites of right bronchus and lung: Secondary | ICD-10-CM | POA: Diagnosis not present

## 2024-03-25 DIAGNOSIS — G9520 Unspecified cord compression: Secondary | ICD-10-CM | POA: Diagnosis not present

## 2024-03-25 DIAGNOSIS — R29898 Other symptoms and signs involving the musculoskeletal system: Secondary | ICD-10-CM | POA: Diagnosis not present

## 2024-03-25 DIAGNOSIS — C7951 Secondary malignant neoplasm of bone: Secondary | ICD-10-CM | POA: Diagnosis not present

## 2024-03-25 DIAGNOSIS — Z79891 Long term (current) use of opiate analgesic: Secondary | ICD-10-CM | POA: Diagnosis not present

## 2024-03-25 DIAGNOSIS — C3491 Malignant neoplasm of unspecified part of right bronchus or lung: Secondary | ICD-10-CM | POA: Diagnosis not present

## 2024-03-25 DIAGNOSIS — Z510 Encounter for antineoplastic radiation therapy: Secondary | ICD-10-CM | POA: Diagnosis not present

## 2024-03-25 DIAGNOSIS — M7918 Myalgia, other site: Secondary | ICD-10-CM | POA: Diagnosis not present

## 2024-03-26 DIAGNOSIS — C3491 Malignant neoplasm of unspecified part of right bronchus or lung: Secondary | ICD-10-CM | POA: Diagnosis not present

## 2024-03-26 DIAGNOSIS — C7951 Secondary malignant neoplasm of bone: Secondary | ICD-10-CM | POA: Diagnosis not present

## 2024-03-26 DIAGNOSIS — R29898 Other symptoms and signs involving the musculoskeletal system: Secondary | ICD-10-CM | POA: Diagnosis not present

## 2024-03-26 DIAGNOSIS — G9520 Unspecified cord compression: Secondary | ICD-10-CM | POA: Diagnosis not present

## 2024-03-26 DIAGNOSIS — Z510 Encounter for antineoplastic radiation therapy: Secondary | ICD-10-CM | POA: Diagnosis not present

## 2024-03-26 DIAGNOSIS — C3481 Malignant neoplasm of overlapping sites of right bronchus and lung: Secondary | ICD-10-CM | POA: Diagnosis not present

## 2024-03-26 DIAGNOSIS — Z79891 Long term (current) use of opiate analgesic: Secondary | ICD-10-CM | POA: Diagnosis not present

## 2024-03-26 DIAGNOSIS — M7918 Myalgia, other site: Secondary | ICD-10-CM | POA: Diagnosis not present

## 2024-03-27 DIAGNOSIS — C7951 Secondary malignant neoplasm of bone: Secondary | ICD-10-CM | POA: Diagnosis not present

## 2024-03-27 DIAGNOSIS — M7918 Myalgia, other site: Secondary | ICD-10-CM | POA: Diagnosis not present

## 2024-03-27 DIAGNOSIS — Z510 Encounter for antineoplastic radiation therapy: Secondary | ICD-10-CM | POA: Diagnosis not present

## 2024-03-27 DIAGNOSIS — Z79891 Long term (current) use of opiate analgesic: Secondary | ICD-10-CM | POA: Diagnosis not present

## 2024-03-27 DIAGNOSIS — R29898 Other symptoms and signs involving the musculoskeletal system: Secondary | ICD-10-CM | POA: Diagnosis not present

## 2024-03-27 DIAGNOSIS — C3481 Malignant neoplasm of overlapping sites of right bronchus and lung: Secondary | ICD-10-CM | POA: Diagnosis not present

## 2024-03-27 DIAGNOSIS — C3491 Malignant neoplasm of unspecified part of right bronchus or lung: Secondary | ICD-10-CM | POA: Diagnosis not present

## 2024-03-27 DIAGNOSIS — G9520 Unspecified cord compression: Secondary | ICD-10-CM | POA: Diagnosis not present

## 2024-03-28 DIAGNOSIS — R29898 Other symptoms and signs involving the musculoskeletal system: Secondary | ICD-10-CM | POA: Diagnosis not present

## 2024-03-28 DIAGNOSIS — C7951 Secondary malignant neoplasm of bone: Secondary | ICD-10-CM | POA: Diagnosis not present

## 2024-03-28 DIAGNOSIS — Z79891 Long term (current) use of opiate analgesic: Secondary | ICD-10-CM | POA: Diagnosis not present

## 2024-03-28 DIAGNOSIS — C3481 Malignant neoplasm of overlapping sites of right bronchus and lung: Secondary | ICD-10-CM | POA: Diagnosis not present

## 2024-03-28 DIAGNOSIS — C3491 Malignant neoplasm of unspecified part of right bronchus or lung: Secondary | ICD-10-CM | POA: Diagnosis not present

## 2024-03-28 DIAGNOSIS — Z510 Encounter for antineoplastic radiation therapy: Secondary | ICD-10-CM | POA: Diagnosis not present

## 2024-03-28 DIAGNOSIS — M7918 Myalgia, other site: Secondary | ICD-10-CM | POA: Diagnosis not present

## 2024-03-28 DIAGNOSIS — G9520 Unspecified cord compression: Secondary | ICD-10-CM | POA: Diagnosis not present

## 2024-03-31 DIAGNOSIS — R29898 Other symptoms and signs involving the musculoskeletal system: Secondary | ICD-10-CM | POA: Diagnosis not present

## 2024-03-31 DIAGNOSIS — C3481 Malignant neoplasm of overlapping sites of right bronchus and lung: Secondary | ICD-10-CM | POA: Diagnosis not present

## 2024-03-31 DIAGNOSIS — C3491 Malignant neoplasm of unspecified part of right bronchus or lung: Secondary | ICD-10-CM | POA: Diagnosis not present

## 2024-03-31 DIAGNOSIS — G9520 Unspecified cord compression: Secondary | ICD-10-CM | POA: Diagnosis not present

## 2024-03-31 DIAGNOSIS — Z510 Encounter for antineoplastic radiation therapy: Secondary | ICD-10-CM | POA: Diagnosis not present

## 2024-03-31 DIAGNOSIS — C7951 Secondary malignant neoplasm of bone: Secondary | ICD-10-CM | POA: Diagnosis not present

## 2024-03-31 DIAGNOSIS — Z79891 Long term (current) use of opiate analgesic: Secondary | ICD-10-CM | POA: Diagnosis not present

## 2024-03-31 DIAGNOSIS — M7918 Myalgia, other site: Secondary | ICD-10-CM | POA: Diagnosis not present

## 2024-04-11 DIAGNOSIS — R300 Dysuria: Secondary | ICD-10-CM | POA: Diagnosis not present

## 2024-04-15 DIAGNOSIS — G9520 Unspecified cord compression: Secondary | ICD-10-CM | POA: Diagnosis not present

## 2024-04-15 DIAGNOSIS — R29898 Other symptoms and signs involving the musculoskeletal system: Secondary | ICD-10-CM | POA: Diagnosis not present

## 2024-04-15 DIAGNOSIS — C7951 Secondary malignant neoplasm of bone: Secondary | ICD-10-CM | POA: Diagnosis not present

## 2024-04-15 DIAGNOSIS — C3481 Malignant neoplasm of overlapping sites of right bronchus and lung: Secondary | ICD-10-CM | POA: Diagnosis not present

## 2024-04-15 DIAGNOSIS — Z79891 Long term (current) use of opiate analgesic: Secondary | ICD-10-CM | POA: Diagnosis not present

## 2024-04-15 DIAGNOSIS — M7918 Myalgia, other site: Secondary | ICD-10-CM | POA: Diagnosis not present

## 2024-04-15 DIAGNOSIS — Z510 Encounter for antineoplastic radiation therapy: Secondary | ICD-10-CM | POA: Diagnosis not present

## 2024-04-15 DIAGNOSIS — C3491 Malignant neoplasm of unspecified part of right bronchus or lung: Secondary | ICD-10-CM | POA: Diagnosis not present

## 2024-04-21 DIAGNOSIS — C3491 Malignant neoplasm of unspecified part of right bronchus or lung: Secondary | ICD-10-CM | POA: Diagnosis not present

## 2024-04-21 DIAGNOSIS — I2510 Atherosclerotic heart disease of native coronary artery without angina pectoris: Secondary | ICD-10-CM | POA: Diagnosis not present

## 2024-04-21 DIAGNOSIS — M898X8 Other specified disorders of bone, other site: Secondary | ICD-10-CM | POA: Diagnosis not present

## 2024-04-24 DIAGNOSIS — C7951 Secondary malignant neoplasm of bone: Secondary | ICD-10-CM | POA: Diagnosis not present

## 2024-04-24 DIAGNOSIS — C3490 Malignant neoplasm of unspecified part of unspecified bronchus or lung: Secondary | ICD-10-CM | POA: Diagnosis not present

## 2024-04-24 DIAGNOSIS — C3491 Malignant neoplasm of unspecified part of right bronchus or lung: Secondary | ICD-10-CM | POA: Diagnosis not present
